# Patient Record
Sex: Female | Born: 1935 | Race: White | Hispanic: No | Smoking: Former smoker
Health system: Southern US, Community
[De-identification: ages and names within clinical notes are randomized; demographics above are authoritative.]

## PROBLEM LIST (undated history)

## (undated) DIAGNOSIS — D649 Anemia, unspecified: Secondary | ICD-10-CM

## (undated) DIAGNOSIS — E785 Hyperlipidemia, unspecified: Secondary | ICD-10-CM

## (undated) DIAGNOSIS — M1990 Unspecified osteoarthritis, unspecified site: Secondary | ICD-10-CM

## (undated) DIAGNOSIS — R011 Cardiac murmur, unspecified: Secondary | ICD-10-CM

## (undated) DIAGNOSIS — I6529 Occlusion and stenosis of unspecified carotid artery: Secondary | ICD-10-CM

## (undated) DIAGNOSIS — N280 Ischemia and infarction of kidney: Secondary | ICD-10-CM

## (undated) DIAGNOSIS — J449 Chronic obstructive pulmonary disease, unspecified: Secondary | ICD-10-CM

## (undated) DIAGNOSIS — I669 Occlusion and stenosis of unspecified cerebral artery: Secondary | ICD-10-CM

## (undated) DIAGNOSIS — Z87442 Personal history of urinary calculi: Secondary | ICD-10-CM

## (undated) DIAGNOSIS — I219 Acute myocardial infarction, unspecified: Secondary | ICD-10-CM

## (undated) DIAGNOSIS — Z9861 Coronary angioplasty status: Secondary | ICD-10-CM

## (undated) DIAGNOSIS — I739 Peripheral vascular disease, unspecified: Secondary | ICD-10-CM

## (undated) DIAGNOSIS — I7409 Other arterial embolism and thrombosis of abdominal aorta: Secondary | ICD-10-CM

## (undated) DIAGNOSIS — I10 Essential (primary) hypertension: Secondary | ICD-10-CM

## (undated) HISTORY — DX: Other arterial embolism and thrombosis of abdominal aorta: I74.09

## (undated) HISTORY — DX: Chronic obstructive pulmonary disease, unspecified: J44.9

## (undated) HISTORY — DX: Ischemia and infarction of kidney: N28.0

## (undated) HISTORY — DX: Essential (primary) hypertension: I10

## (undated) HISTORY — DX: Personal history of urinary calculi: Z87.442

## (undated) HISTORY — DX: Cardiac murmur, unspecified: R01.1

## (undated) HISTORY — DX: Unspecified osteoarthritis, unspecified site: M19.90

## (undated) HISTORY — DX: Anemia, unspecified: D64.9

## (undated) HISTORY — DX: Hyperlipidemia, unspecified: E78.5

## (undated) HISTORY — DX: Occlusion and stenosis of unspecified cerebral artery: I66.9

## (undated) HISTORY — DX: Peripheral vascular disease, unspecified: I73.9

## (undated) HISTORY — DX: Coronary angioplasty status: Z98.61

## (undated) HISTORY — DX: Occlusion and stenosis of unspecified carotid artery: I65.29

## (undated) HISTORY — PX: OTHER SURGICAL HISTORY: SHX169

## (undated) HISTORY — DX: Acute myocardial infarction, unspecified: I21.9

---

## 1995-01-07 DIAGNOSIS — I219 Acute myocardial infarction, unspecified: Secondary | ICD-10-CM

## 1995-01-07 HISTORY — DX: Acute myocardial infarction, unspecified: I21.9

## 1995-01-07 HISTORY — PX: COLON SURGERY: SHX602

## 1995-05-07 DIAGNOSIS — Z9861 Coronary angioplasty status: Secondary | ICD-10-CM

## 1995-05-07 HISTORY — DX: Coronary angioplasty status: Z98.61

## 1996-09-06 HISTORY — PX: CAROTID ENDARTERECTOMY: SUR193

## 1998-03-07 DIAGNOSIS — J449 Chronic obstructive pulmonary disease, unspecified: Secondary | ICD-10-CM

## 1998-03-07 DIAGNOSIS — I7409 Other arterial embolism and thrombosis of abdominal aorta: Secondary | ICD-10-CM

## 1998-03-07 DIAGNOSIS — N280 Ischemia and infarction of kidney: Secondary | ICD-10-CM

## 1998-03-07 DIAGNOSIS — I739 Peripheral vascular disease, unspecified: Secondary | ICD-10-CM

## 1998-03-07 HISTORY — DX: Ischemia and infarction of kidney: N28.0

## 1998-03-07 HISTORY — DX: Peripheral vascular disease, unspecified: I73.9

## 1998-03-07 HISTORY — DX: Other arterial embolism and thrombosis of abdominal aorta: I74.09

## 1998-03-07 HISTORY — DX: Chronic obstructive pulmonary disease, unspecified: J44.9

## 1998-04-02 ENCOUNTER — Encounter: Payer: Self-pay | Admitting: Vascular Surgery

## 1998-04-03 ENCOUNTER — Encounter: Payer: Self-pay | Admitting: Vascular Surgery

## 1998-04-03 ENCOUNTER — Inpatient Hospital Stay: Admission: RE | Admit: 1998-04-03 | Discharge: 1998-04-08 | Payer: Self-pay | Admitting: Vascular Surgery

## 1998-04-03 DIAGNOSIS — I669 Occlusion and stenosis of unspecified cerebral artery: Secondary | ICD-10-CM

## 1998-04-03 DIAGNOSIS — I6529 Occlusion and stenosis of unspecified carotid artery: Secondary | ICD-10-CM

## 1998-04-03 HISTORY — DX: Occlusion and stenosis of unspecified cerebral artery: I66.9

## 1998-04-03 HISTORY — DX: Occlusion and stenosis of unspecified carotid artery: I65.29

## 1998-04-03 HISTORY — PX: PR BPG OTH/THN VEIN AORTOBIFEMORAL: 35646

## 1998-04-04 ENCOUNTER — Encounter: Payer: Self-pay | Admitting: Vascular Surgery

## 2000-07-15 ENCOUNTER — Encounter: Admission: RE | Admit: 2000-07-15 | Discharge: 2000-07-15 | Payer: Self-pay | Admitting: Vascular Surgery

## 2000-07-15 ENCOUNTER — Encounter: Payer: Self-pay | Admitting: Vascular Surgery

## 2000-11-26 ENCOUNTER — Encounter (INDEPENDENT_AMBULATORY_CARE_PROVIDER_SITE_OTHER): Payer: Self-pay | Admitting: Specialist

## 2000-11-26 ENCOUNTER — Other Ambulatory Visit: Admission: RE | Admit: 2000-11-26 | Discharge: 2000-11-26 | Payer: Self-pay | Admitting: Oncology

## 2001-02-21 ENCOUNTER — Emergency Department (HOSPITAL_COMMUNITY): Admission: EM | Admit: 2001-02-21 | Discharge: 2001-02-21 | Payer: Self-pay | Admitting: *Deleted

## 2001-05-26 ENCOUNTER — Encounter (INDEPENDENT_AMBULATORY_CARE_PROVIDER_SITE_OTHER): Payer: Self-pay | Admitting: *Deleted

## 2001-05-26 ENCOUNTER — Ambulatory Visit (HOSPITAL_COMMUNITY): Admission: RE | Admit: 2001-05-26 | Discharge: 2001-05-26 | Payer: Self-pay | Admitting: Gastroenterology

## 2003-04-18 ENCOUNTER — Ambulatory Visit (HOSPITAL_COMMUNITY): Admission: RE | Admit: 2003-04-18 | Discharge: 2003-04-18 | Payer: Self-pay | Admitting: Urology

## 2003-04-20 ENCOUNTER — Ambulatory Visit (HOSPITAL_COMMUNITY): Admission: RE | Admit: 2003-04-20 | Discharge: 2003-04-20 | Payer: Self-pay | Admitting: Urology

## 2003-06-07 DIAGNOSIS — D649 Anemia, unspecified: Secondary | ICD-10-CM

## 2003-06-07 HISTORY — DX: Anemia, unspecified: D64.9

## 2003-11-08 ENCOUNTER — Other Ambulatory Visit: Admission: RE | Admit: 2003-11-08 | Discharge: 2003-11-08 | Payer: Self-pay | Admitting: Family Medicine

## 2003-12-07 ENCOUNTER — Ambulatory Visit (HOSPITAL_COMMUNITY): Admission: RE | Admit: 2003-12-07 | Discharge: 2003-12-07 | Payer: Self-pay | Admitting: Gastroenterology

## 2003-12-07 ENCOUNTER — Encounter (INDEPENDENT_AMBULATORY_CARE_PROVIDER_SITE_OTHER): Payer: Self-pay | Admitting: Specialist

## 2004-12-25 ENCOUNTER — Other Ambulatory Visit: Admission: RE | Admit: 2004-12-25 | Discharge: 2004-12-25 | Payer: Self-pay | Admitting: Family Medicine

## 2006-07-22 ENCOUNTER — Ambulatory Visit: Payer: Self-pay | Admitting: Vascular Surgery

## 2007-03-30 ENCOUNTER — Ambulatory Visit: Payer: Self-pay | Admitting: Vascular Surgery

## 2007-09-07 ENCOUNTER — Ambulatory Visit: Payer: Self-pay | Admitting: Vascular Surgery

## 2007-10-08 ENCOUNTER — Ambulatory Visit: Payer: Self-pay | Admitting: Urology

## 2008-03-28 ENCOUNTER — Ambulatory Visit: Payer: Self-pay | Admitting: Vascular Surgery

## 2008-04-18 ENCOUNTER — Ambulatory Visit: Payer: Self-pay | Admitting: Vascular Surgery

## 2008-05-25 ENCOUNTER — Ambulatory Visit: Payer: Self-pay | Admitting: Vascular Surgery

## 2008-05-25 ENCOUNTER — Encounter: Payer: Self-pay | Admitting: Vascular Surgery

## 2008-05-25 ENCOUNTER — Inpatient Hospital Stay (HOSPITAL_COMMUNITY): Admission: RE | Admit: 2008-05-25 | Discharge: 2008-05-26 | Payer: Self-pay | Admitting: Vascular Surgery

## 2008-05-25 HISTORY — PX: CAROTID ENDARTERECTOMY: SUR193

## 2008-06-20 ENCOUNTER — Ambulatory Visit: Payer: Self-pay | Admitting: Vascular Surgery

## 2009-01-06 DIAGNOSIS — M1990 Unspecified osteoarthritis, unspecified site: Secondary | ICD-10-CM

## 2009-01-06 HISTORY — DX: Unspecified osteoarthritis, unspecified site: M19.90

## 2009-01-18 ENCOUNTER — Ambulatory Visit: Payer: Self-pay | Admitting: Vascular Surgery

## 2009-01-18 ENCOUNTER — Encounter: Admission: RE | Admit: 2009-01-18 | Discharge: 2009-01-18 | Payer: Self-pay | Admitting: Vascular Surgery

## 2010-01-23 ENCOUNTER — Ambulatory Visit
Admission: RE | Admit: 2010-01-23 | Discharge: 2010-01-23 | Payer: Self-pay | Source: Home / Self Care | Attending: Vascular Surgery | Admitting: Vascular Surgery

## 2010-01-27 ENCOUNTER — Encounter: Payer: Self-pay | Admitting: Urology

## 2010-02-01 NOTE — Procedures (Unsigned)
CAROTID DUPLEX EXAM  INDICATION:  Carotid disease.  HISTORY: Diabetes:  No. Cardiac:  PTCA. Hypertension:  Yes. Smoking:  No. Previous Surgery:  Left carotid endarterectomy in 1998, right carotid endarterectomy with right common carotid artery resection on 05/25/2008. CV History:  Currently asymptomatic. Amaurosis Fugax No, Paresthesias No, Hemiparesis No.                                      RIGHT             LEFT Brachial systolic pressure:         170               172 Brachial Doppler waveforms:         Normal            Normal Vertebral direction of flow:        Antegrade         Antegrade DUPLEX VELOCITIES (cm/sec) CCA peak systolic                   80                70 ECA peak systolic                   112               67 ICA peak systolic                   74                43 ICA end diastolic                   13                10 PLAQUE MORPHOLOGY:                                    Heterogenous PLAQUE AMOUNT:                      None              Mild PLAQUE LOCATION:                                      ICA  IMPRESSION: 1. Patent bilateral common iliac artery sites with no hemodynamically     significant stenosis noted in the bilateral internal carotid     arteries. 2. No significant change in Doppler velocities noted when compared to     the previous examination on 01/18/2009.      ___________________________________________ [name]. Edilia Bo, M.D.  CH/MEDQ  D:  01/23/2010  T:  01/23/2010  Job:  [id number]

## 2010-04-16 LAB — BASIC METABOLIC PANEL
BUN: 12 mg/dL (ref 6–23)
Calcium: 8.7 mg/dL (ref 8.4–10.5)
Chloride: 107 mEq/L (ref 96–112)
Glucose, Bld: 123 mg/dL — ABNORMAL HIGH (ref 70–99)

## 2010-04-16 LAB — CBC
HCT: 30.6 % — ABNORMAL LOW (ref 36.0–46.0)
Hemoglobin: 10.6 g/dL — ABNORMAL LOW (ref 12.0–15.0)
Hemoglobin: 13.1 g/dL (ref 12.0–15.0)
MCHC: 34.7 g/dL (ref 30.0–36.0)
MCV: 89.6 fL (ref 78.0–100.0)
RBC: 3.43 MIL/uL — ABNORMAL LOW (ref 3.87–5.11)
RDW: 12.7 % (ref 11.5–15.5)
WBC: 9.1 10*3/uL (ref 4.0–10.5)

## 2010-04-16 LAB — TYPE AND SCREEN: ABO/RH(D): A POS

## 2010-04-16 LAB — COMPREHENSIVE METABOLIC PANEL
Albumin: 3.5 g/dL (ref 3.5–5.2)
Alkaline Phosphatase: 70 U/L (ref 39–117)
CO2: 29 mEq/L (ref 19–32)
Sodium: 141 mEq/L (ref 135–145)
Total Bilirubin: 0.7 mg/dL (ref 0.3–1.2)
Total Protein: 6.7 g/dL (ref 6.0–8.3)

## 2010-04-16 LAB — APTT: aPTT: 28 seconds (ref 24–37)

## 2010-04-16 LAB — URINALYSIS, ROUTINE W REFLEX MICROSCOPIC
Bilirubin Urine: NEGATIVE
Nitrite: NEGATIVE
Specific Gravity, Urine: 1.019 (ref 1.005–1.030)

## 2010-04-16 LAB — URINE MICROSCOPIC-ADD ON

## 2010-04-16 LAB — ABO/RH: ABO/RH(D): A POS

## 2010-04-24 ENCOUNTER — Emergency Department (HOSPITAL_COMMUNITY): Payer: Medicare Other

## 2010-04-24 ENCOUNTER — Inpatient Hospital Stay (HOSPITAL_COMMUNITY)
Admission: EM | Admit: 2010-04-24 | Discharge: 2010-05-03 | DRG: 312 | Disposition: A | Discharge status: Home Health | Payer: Medicare Other | Attending: Internal Medicine | Admitting: Internal Medicine

## 2010-04-24 ENCOUNTER — Inpatient Hospital Stay (HOSPITAL_COMMUNITY): Payer: Medicare Other

## 2010-04-24 DIAGNOSIS — J449 Chronic obstructive pulmonary disease, unspecified: Secondary | ICD-10-CM | POA: Diagnosis present

## 2010-04-24 DIAGNOSIS — J4489 Other specified chronic obstructive pulmonary disease: Secondary | ICD-10-CM | POA: Diagnosis present

## 2010-04-24 DIAGNOSIS — N200 Calculus of kidney: Secondary | ICD-10-CM | POA: Diagnosis present

## 2010-04-24 DIAGNOSIS — S2249XA Multiple fractures of ribs, unspecified side, initial encounter for closed fracture: Secondary | ICD-10-CM | POA: Diagnosis present

## 2010-04-24 DIAGNOSIS — Z951 Presence of aortocoronary bypass graft: Secondary | ICD-10-CM

## 2010-04-24 DIAGNOSIS — D509 Iron deficiency anemia, unspecified: Secondary | ICD-10-CM | POA: Diagnosis present

## 2010-04-24 DIAGNOSIS — E785 Hyperlipidemia, unspecified: Secondary | ICD-10-CM | POA: Diagnosis present

## 2010-04-24 DIAGNOSIS — E7800 Pure hypercholesterolemia, unspecified: Secondary | ICD-10-CM | POA: Diagnosis present

## 2010-04-24 DIAGNOSIS — I5030 Unspecified diastolic (congestive) heart failure: Secondary | ICD-10-CM | POA: Diagnosis present

## 2010-04-24 DIAGNOSIS — I2510 Atherosclerotic heart disease of native coronary artery without angina pectoris: Secondary | ICD-10-CM | POA: Diagnosis present

## 2010-04-24 DIAGNOSIS — R296 Repeated falls: Secondary | ICD-10-CM | POA: Diagnosis present

## 2010-04-24 DIAGNOSIS — R799 Abnormal finding of blood chemistry, unspecified: Secondary | ICD-10-CM | POA: Diagnosis present

## 2010-04-24 DIAGNOSIS — D638 Anemia in other chronic diseases classified elsewhere: Secondary | ICD-10-CM | POA: Diagnosis present

## 2010-04-24 DIAGNOSIS — I10 Essential (primary) hypertension: Secondary | ICD-10-CM | POA: Diagnosis present

## 2010-04-24 DIAGNOSIS — N179 Acute kidney failure, unspecified: Secondary | ICD-10-CM | POA: Diagnosis present

## 2010-04-24 DIAGNOSIS — I4891 Unspecified atrial fibrillation: Secondary | ICD-10-CM | POA: Diagnosis present

## 2010-04-24 DIAGNOSIS — D696 Thrombocytopenia, unspecified: Secondary | ICD-10-CM | POA: Diagnosis present

## 2010-04-24 DIAGNOSIS — J90 Pleural effusion, not elsewhere classified: Secondary | ICD-10-CM | POA: Diagnosis present

## 2010-04-24 DIAGNOSIS — I509 Heart failure, unspecified: Secondary | ICD-10-CM | POA: Diagnosis present

## 2010-04-24 DIAGNOSIS — E876 Hypokalemia: Secondary | ICD-10-CM | POA: Diagnosis present

## 2010-04-24 DIAGNOSIS — E860 Dehydration: Secondary | ICD-10-CM | POA: Diagnosis present

## 2010-04-24 DIAGNOSIS — Y92009 Unspecified place in unspecified non-institutional (private) residence as the place of occurrence of the external cause: Secondary | ICD-10-CM

## 2010-04-24 DIAGNOSIS — I951 Orthostatic hypotension: Principal | ICD-10-CM | POA: Diagnosis present

## 2010-04-24 LAB — POCT I-STAT, CHEM 8
Chloride: 102 mEq/L (ref 96–112)
Creatinine, Ser: 1.6 mg/dL — ABNORMAL HIGH (ref 0.4–1.2)
Potassium: 3.2 mEq/L — ABNORMAL LOW (ref 3.5–5.1)

## 2010-04-24 LAB — CARDIAC PANEL(CRET KIN+CKTOT+MB+TROPI)
CK, MB: 5.7 ng/mL — ABNORMAL HIGH (ref 0.3–4.0)
Relative Index: 4.2 — ABNORMAL HIGH (ref 0.0–2.5)
Total CK: 137 U/L (ref 7–177)

## 2010-04-24 LAB — BRAIN NATRIURETIC PEPTIDE: Pro B Natriuretic peptide (BNP): 175 pg/mL — ABNORMAL HIGH (ref 0.0–100.0)

## 2010-04-24 LAB — DIFFERENTIAL
Basophils Relative: 0 % (ref 0–1)
Eosinophils Relative: 0 % (ref 0–5)
Lymphocytes Relative: 4 % — ABNORMAL LOW (ref 12–46)
Monocytes Relative: 4 % (ref 3–12)

## 2010-04-24 LAB — COMPREHENSIVE METABOLIC PANEL
AST: 31 U/L (ref 0–37)
BUN: 20 mg/dL (ref 6–23)
CO2: 32 mEq/L (ref 19–32)
Calcium: 11.4 mg/dL — ABNORMAL HIGH (ref 8.4–10.5)
GFR calc non Af Amer: 38 mL/min — ABNORMAL LOW (ref >60)
Potassium: 3.8 mEq/L (ref 3.5–5.1)
Total Bilirubin: 1 mg/dL (ref 0.3–1.2)

## 2010-04-24 LAB — CBC
HCT: 36 % (ref 36.0–46.0)
Hemoglobin: 12 g/dL (ref 12.0–15.0)
MCH: 29.7 pg (ref 26.0–34.0)
Platelets: 112 10*3/uL — ABNORMAL LOW (ref 150–400)
RBC: 4.04 MIL/uL (ref 3.87–5.11)
WBC: 7.7 10*3/uL (ref 4.0–10.5)

## 2010-04-24 LAB — TSH: TSH: 0.164 u[IU]/mL — ABNORMAL LOW (ref 0.350–4.500)

## 2010-04-24 LAB — PHOSPHORUS: Phosphorus: 3.7 mg/dL (ref 2.3–4.6)

## 2010-04-24 LAB — LIPID PANEL
Cholesterol: 153 mg/dL (ref 0–200)
LDL Cholesterol: 68 mg/dL (ref 0–99)
Total CHOL/HDL Ratio: 2.3 RATIO

## 2010-04-24 LAB — HEMOGLOBIN A1C
Hgb A1c MFr Bld: 5.6 % (ref <5.7)
Mean Plasma Glucose: 114 mg/dL (ref <117)

## 2010-04-24 LAB — CK TOTAL AND CKMB (NOT AT ARMC): Relative Index: INVALID (ref 0.0–2.5)

## 2010-04-24 NOTE — H&P (Signed)
NAMEHELANE, [name]                 ACCOUNT NO.:  [id number]  MEDICAL RECORD NO.:  [id number]           PATIENT TYPE:  E  LOCATION:  WLED                         FACILITY:  [facility]  PHYSICIAN:  Kathlen Mody, MD       DATE OF BIRTH:  [birth year]  DATE OF ADMISSION:  04/24/2010 DATE OF DISCHARGE:                             HISTORY & PHYSICAL   PRIMARY CARE PHYSICIAN:  An MD at [facility].  VASCULAR SURGEON:  Maisie Fus B. Durwin Nora, P.A.  CHIEF COMPLAINT:  Syncope.  HISTORY OF PRESENT ILLNESS:  This is an 77 year old lady with past medical history of hypertension, coronary artery disease status post CABG, bilateral carotid artery disease status post bilateral carotid endarterectomy, hyperlipidemia and COPD, who was brought in by her son to the ER for syncope at home this a.m.  Most of the history obtained was from the patient and the patient's son at bedside.  This is an 41- year-old lady who woke up earlier this morning around 5:00, went to the bathroom.  As she was coming back from the bathroom, she felt dizzy and felt that she was blacking out and the next minute she knew she was on the floor and could not tell where she was at that time.  There was no witness to her syncope.  The patient is unaware of how long she was on the floor.  The patient slowly got up from the floor and called her son from her cellphone.  The son came back to her house at 7:00 and picked her up and brought her to the ER.  On arrival to the ER, the patient was found to have a small laceration on the left side of the forehead.  The patient had a chest x-ray also done down in the ER.  She was found to have multiple rib fractures on the right side from 4-10.  The patient was complaining of right-sided chest pain for which the chest x-ray was done.  She did complain of dizziness just before the fall.  Denied chest pain, shortness of breath or palpitations.  The patient does complain of occasional dizziness, feels  like she is blacking out, but she never syncopized before.  The patient has a recent history of shingles in March 2012 for which she was treated with antivirals and pain medication.  The patient denies any fever or chills at this time. Denies any cough.  The patient denies any nausea, vomiting, diarrhea or abdominal pain.  The patient denies any tingling or numbness in her extremities.  The patient complains of generalized weakness.  The patient has an extensive coronary artery disease, has a history of CABG and history of bilateral carotid artery disease with bilateral endarterectomy.  The patient denies any headache or blurry vision at this time.  PAST MEDICAL HISTORY: 1. Coronary artery disease status post CABG. 2. Carotid artery disease status post left carotid endarterectomy in     1998 and right carotid endarterectomy in 2010. 3. Hypertension. 4. Hypercholesterolemia. 5. COPD. 6. The patient has nephrolithiasis.  SOCIAL HISTORY:  The patient is widowed.  She lives at  home by herself. She has 3 children.  She denies smoking and alcohol at this time.  ALLERGIES:  She is allergic to CODEINE and MORPHINE SULFATE.  PAST SURGICAL HISTORY: 1. Bilateral carotid endarterectomy. 2. She had aortobifemoral bypass graft placement and right renal     artery bypass graft placement in 2000.  MEDICATIONS:  See med recon for detailed medications and their doses.  REVIEW OF SYSTEMS:  See HPI, otherwise negative.  PHYSICAL EXAMINATION:  VITAL SIGNS:  She is afebrile.  Blood pressure of 136/80, pulse of 72, respiratory rate of 18, saturating 100% on 3 liters of oxygen. GENERAL:  On exam, she is alert, afebrile, in mild distress from right- sided chest pain. HEENT:  Pupils equal and reacting to light.  No JVD.  Dry mucous membranes. NECK:  She has scars on the neck from bilateral carotid endarterectomy. CARDIOVASCULAR:  S1, S2 heard.  Regular rate and rhythm. RESPIRATORY:  Decreased air  entry on the right side.  Air entry is good on the left side.  No wheezing or rhonchi. ABDOMEN:  Soft, nontender, nondistended.  Bowel sounds heard. EXTREMITIES:  No pedal edema. NEUROLOGICAL:  Able to move all extremities at this time.  The patient is on bedrest.  PERTINENT LABS:  The patient had an i-STAT done.  It showed a hemoglobin of 12.9, hematocrit of 38, sodium of 141, potassium of 3.2, chloride 102, glucose 148, BUN 22, creatinine 1.6, bicarb of 31.  RADIOLOGY:  The patient had a CT head without contrast which shows atrophy and chronic microvascular ischemia.  No acute intracranial abnormality.  Chest x-ray with rib x-ray shows multiple right-sided rib fractures as high as the fourth rib and as low as tenth rib.  No definite pneumothorax or hemothorax.  PROBLEM LIST: 1. Syncope. 2. Rib fracture. 3. Hypertension. 4. Hyperlipidemia. 5. Hypokalemia. 6. Acute renal failure.  PLAN: 1. Syncope.  The patient will be admitted to telemetry floor.  Her EKG     was normal sinus rhythm at 72 per minute.  We will get cardiac     enzymes x3 q.6 h.  She will get the syncope workup.  Her CT head     without contrast did not show any acute findings.  We will get a 2-     D echocardiogram.  We will order carotid duplex.  The patient will     be continued on aspirin 81 mg at this time.  The patient will be on     bedrest and with fall precautions.  We will also try to rule out if     the syncope is secondary to her dehydration.  The patient was     already started on IV fluids in the ER.  We will get orthostatics     done at bedside by PT daily for the next few days.  We will also     try to rule out erythema.  We will keep her on telemetry and     monitor for the next 24-48 hours. 2. Multiple rib fractures on the right starting from fourth to tenth     rib.  We will keep her bedrest, pain control, give her incentive     spirometry.  We will get an orthopedic consult for further      recommendations on rib fractures for any intervention needed. 3. Hypertension.  Blood pressure appears to be controlled.  We will     hold her Norvasc at this time. 4. Hyperlipidemia.  We will get a fasting lipid profile and continue     her simvastatin at this time. 5. Hypokalemia.  Replete potassium. 6. Acute renal failure most likely secondary to prerenal azotemia.     The patient has dry mucous membranes.  We will hydrate her with IV     fluids, normal saline at 80 cc/hour and repeat BUN and creatinine     in a.m. 7. For DVT prophylaxis, subcu Lovenox. 8. The patient is full code.          ______________________________ Kathlen Mody, MD     VA/MEDQ  D:  04/24/2010  T:  04/24/2010  Job:  [id number]  Electronically Signed by Kathlen Mody MD on 04/24/2010 09:51:29 PM

## 2010-04-25 ENCOUNTER — Inpatient Hospital Stay (HOSPITAL_COMMUNITY): Payer: Medicare Other

## 2010-04-25 LAB — T4, FREE: Free T4: 1.19 ng/dL (ref 0.80–1.80)

## 2010-04-25 LAB — BASIC METABOLIC PANEL
BUN: 17 mg/dL (ref 6–23)
CO2: 29 mEq/L (ref 19–32)
Calcium: 9.8 mg/dL (ref 8.4–10.5)
Chloride: 112 mEq/L (ref 96–112)
Creatinine, Ser: 1.15 mg/dL (ref 0.4–1.2)
GFR calc Af Amer: 55 mL/min — ABNORMAL LOW (ref >60)
Glucose, Bld: 128 mg/dL — ABNORMAL HIGH (ref 70–99)

## 2010-04-25 LAB — CBC
HCT: 30.5 % — ABNORMAL LOW (ref 36.0–46.0)
Hemoglobin: 10.1 g/dL — ABNORMAL LOW (ref 12.0–15.0)
MCH: 30.1 pg (ref 26.0–34.0)
MCHC: 33.1 g/dL (ref 30.0–36.0)
MCV: 90.8 fL (ref 78.0–100.0)
RBC: 3.36 MIL/uL — ABNORMAL LOW (ref 3.87–5.11)

## 2010-04-25 LAB — URINALYSIS, ROUTINE W REFLEX MICROSCOPIC
Bilirubin Urine: NEGATIVE
Hgb urine dipstick: NEGATIVE
Specific Gravity, Urine: 1.015 (ref 1.005–1.030)
Urobilinogen, UA: 0.2 mg/dL (ref 0.0–1.0)
pH: 6 (ref 5.0–8.0)

## 2010-04-25 LAB — URINE MICROSCOPIC-ADD ON

## 2010-04-25 LAB — CARDIAC PANEL(CRET KIN+CKTOT+MB+TROPI): CK, MB: 5.7 ng/mL — ABNORMAL HIGH (ref 0.3–4.0)

## 2010-04-26 LAB — PTH, INTACT AND CALCIUM: PTH: 31.7 pg/mL (ref 14.0–72.0)

## 2010-04-26 LAB — BASIC METABOLIC PANEL
BUN: 14 mg/dL (ref 6–23)
Creatinine, Ser: 1.15 mg/dL (ref 0.4–1.2)
GFR calc Af Amer: 55 mL/min — ABNORMAL LOW (ref >60)
GFR calc non Af Amer: 45 mL/min — ABNORMAL LOW (ref >60)
Potassium: 3.6 mEq/L (ref 3.5–5.1)

## 2010-04-26 LAB — CBC
MCH: 29.5 pg (ref 26.0–34.0)
MCV: 90 fL (ref 78.0–100.0)
Platelets: 94 10*3/uL — ABNORMAL LOW (ref 150–400)
RBC: 3.49 MIL/uL — ABNORMAL LOW (ref 3.87–5.11)
RDW: 13.4 % (ref 11.5–15.5)
WBC: 6.1 10*3/uL (ref 4.0–10.5)

## 2010-04-26 LAB — URINE CULTURE: Culture: NO GROWTH

## 2010-04-26 LAB — MAGNESIUM: Magnesium: 1.7 mg/dL (ref 1.5–2.5)

## 2010-04-27 ENCOUNTER — Inpatient Hospital Stay (HOSPITAL_COMMUNITY): Payer: Medicare Other

## 2010-04-27 LAB — CBC
MCH: 29.3 pg (ref 26.0–34.0)
MCHC: 32.3 g/dL (ref 30.0–36.0)
Platelets: 94 10*3/uL — ABNORMAL LOW (ref 150–400)
RBC: 3.48 MIL/uL — ABNORMAL LOW (ref 3.87–5.11)
RDW: 13.5 % (ref 11.5–15.5)

## 2010-04-27 LAB — IRON AND TIBC: Iron: 24 ug/dL — ABNORMAL LOW (ref 42–135)

## 2010-04-27 LAB — BASIC METABOLIC PANEL
Chloride: 106 mEq/L (ref 96–112)
GFR calc non Af Amer: 44 mL/min — ABNORMAL LOW (ref >60)
Potassium: 4.4 mEq/L (ref 3.5–5.1)
Sodium: 142 mEq/L (ref 135–145)

## 2010-04-27 LAB — FERRITIN: Ferritin: 480 ng/mL — ABNORMAL HIGH (ref 10–291)

## 2010-04-27 NOTE — Consult Note (Signed)
[name], [name]                 ACCOUNT NO.:  [id number]  MEDICAL RECORD NO.:  [id number]           PATIENT TYPE:  I  LOCATION:  1411                         FACILITY:  [facility]  PHYSICIAN:  Jake Bathe, MD      DATE OF BIRTH:  18-Apr-1927  DATE OF CONSULTATION:  04/25/2010 DATE OF DISCHARGE:                                CONSULTATION   PRIMARY CARE PHYSICIAN:  Dr. Juluis Rainier.  CARDIOLOGIST:  [name], M.D.  REASON FOR CONSULTATION:  Evaluation of syncope as well as elevated troponin.  HISTORY OF PRESENT ILLNESS:  [name] is a very pleasant 77 year old female with coronary artery disease, single vessel, with percutaneous intervention in 1998 by Dr. Amil Amen with peripheral vascular disease, bilateral carotid endarterectomies as well as renal bypass as well as aortobifemoral bypass who sustained a fall, which prompted admission. She states that over the past month or so, she has been treated for right-sided chest wall shingles and they have persisted and have caused a rash as well as some discomfort.  On the night of admission on April 24, 2010, she had gotten up in the middle of night to go to the bathroom to urinate.  She urinated, stood up, was walking towards the bedroom, then felt woozy and passed out.  When she came to she was somewhat confused, had thought at one point that she was under the bed.  She was having difficulty getting up and finally a family member was able to help her after quite some time.  She was unaware of how long she was on the floor.  In the past, she had had some episodes of dizziness, but she has never had any episodes of frank syncope.  She denies any chest discomfort or shortness of breath prior to this episode and now she is having right-sided rib pain due to several fractures in that region.  Cardiac biomarkers were obtained and were mildly abnormal, the most recent demonstrating a troponin of 0.11 with an MB of 5.7 and a CK  of 154.  Her BNP was slightly elevated 175 and on admission her creatinine was 1.35, now down to 1.17.  She currently is resting comfortably in her bed after receiving IV fluids.  PAST MEDICAL HISTORY: 1. Single-vessel coronary artery disease, status post intervention by     Dr. Amil Amen in 1998. 2. Hypertension. 3. Hyperlipidemia. 4. Nephrolithiasis. 5. Single right kidney with bypass to kidney after aortobifemoral     bypass. 6. COPD. 7. History of tobacco abuse, quit in 2000 after 60 years of use.  PAST SURGICAL HISTORY:  Aortobifemoral bypass with right renal artery bypass in 2000, carotid endarterectomies with patch angioplasty in 1998, followed by Dr. Edilia Bo.  SOCIAL HISTORY:  She is widowed, lives alone by herself, is independent, has 3 children.  No current smoking or alcohol.  ALLERGIES:  CODEINE, MORPHINE.  MEDICATIONS AT HOME:  She had been taking, 1. Simvastatin 20 mg a day. 2. Percocet p.r.n. 3. Norvasc 5 mg a day. 4. Aspirin 81 mg a day. 5. Fish oil 4 times a day. 6. Iron tablet daily.  7. Doxazosin 8 mg a day.  FAMILY HISTORY:  Currently noncontributory.  REVIEW OF SYSTEMS:  No bleeding.  No orthopnea.  No preceding chest pain or significant shortness of breath.  Unless specified above, all other 12 review of systems negative.  PHYSICAL EXAMINATION:  VITAL SIGNS:  Blood pressure on arrival to hospital was 136/80 with a pulse of 72; currently, she is 151/65; temperature 98.2; pulse of 77, sinus rhythm; respirations 18; saturating 96% on 2 L.  Orthostatics were performed, which demonstrates a significant change from 179/67 while lying up to 125/63 while standing and this was performed today.  Note, currently on her medications, she is not on any of her antihypertensives, which were doxazosin 8 mg at home as well as Norvasc 5 mg at home. GENERAL:  Alert and oriented x3, no acute distress, resting comfortably in bed. EYES:  Pale conjunctivae.  EOMI.  No  scleral icterus. NECK:  Supple with right-sided carotid bruit, greater than left. Carotid endarterectomy scars noted.  No JVD. CARDIOVASCULAR:  Demonstrates a regular rate and rhythm with a 2/6 systolic murmur heard at right upper sternal border.  Normal PMI. LUNGS:  Clear to auscultation bilaterally with mildly increased expiratory phase.  No wheezes, no rales currently.  ABDOMEN:  Soft, nontender.  Normoactive bowel sounds.  No rebound or guarding. EXTREMITIES:  No clubbing, cyanosis, or edema.  Decreased distal pulses. SKIN:  Warm, dry, and intact with ecchymosis noted especially. HEENT:  A scalp laceration that is sutured. GU:  Deferred. RECTAL:  Deferred. NEURO:  Nonfocal.  No tremors are noted.  LABORATORY DATA:  EKG personally reviewed show sinus rhythm, rate 63, with no other abnormalities.  QTC was 425.  Chest x-ray, personally reviewed, shows moderate right pleural effusion and confluent right lower lobe airspace disease, possible pneumonia, multiple right lateral rib fractures noted.  Echocardiogram personally reviewed shows hyperdynamic left ventricular systolic function of 65% to 70% with mild aortic stenosis, calcified mitral valve ends as well as calcified aortic valve with mildly increased pulmonary artery pressures.  Mildly elevated troponin, MB as noted above, hemoglobin A1c 5.6, TSH was low at 0.1, LDL was 68 at goal, hemoglobin was 12 on admission. Platelet count 112.  ASSESSMENT/PLAN:  An 77 year old female who sustained syncopal episode at home with now documented orthostatic hypotension and mildly elevated troponin as well as MB fraction. 1. Syncope - possible etiologies certainly include orthostatic     hypotension, especially that pressures have been documented during     this hospitalization dropping from 179 down to 125 upon standing.     Certainly, her doxazosin at home may have exacerbated orthostasis     as well as her concomitant shingles illness and  likely     intravascular volume depletion.  This is the most likely etiology     behind her syncope.  Of course, a cardiac etiology cannot be fully     excluded and thus far on telemetry she has been normal.  No     evidence of any pauses.  There is no evidence of any     intraventricular conduction delays or prolonged QT.  Her     echocardiogram does demonstrate structurally normal ejection     fraction with no wall motion abnormalities.  She is hyperdynamic     and with dehydration, this hyperdynamic nature could be     exacerbated.  PLAN: 1. Syncope would be to place a 30-day event monitor upon discharge,     for which I  can help establish or get set up for you.  I do believe     at this time we will allow [place] to further illustrate if there was     any signs of any cardiac arrhythmias present.  Most likely,     however, this is orthostatic in origin, given the documented     orthostatic hypotension or orthostatic changes. 2. Positive troponin - mildly elevated troponin as well as MB fraction     in the setting of recent syncopal illness, most likely a type 2 non-     ST-elevation myocardial infarction that is not true, not     traditional acute coronary syndrome.  This is most likely secondary     to underlying medical illness.  She does have underlying coronary     artery disease, however.  I would continue with current medical     management, which includes aspirin and I would restart statin     therapy.  I would be hesitant to start beta-blocker in her     situation, given her orthostatic changes noted on standing.     Continue to monitor.  Her current chest discomfort is right sided     and secondary to rib fractures.  I do not believe that at this     point any further cardiac testing is warranted.  Of course if after     her rib fractures heal if she is having difficulty or angina-like     symptoms one could consider a nuclear stress testing for further     risk  stratification.  This could be done as an outpatient. 3. Coronary artery disease - single-vessel coronary artery disease,     treated by Dr. Amil Amen in the late 90s.  This was done prior to her     aortobifemoral bypass as well as carotid endarterectomies.  I would     continue with aspirin and statin use. 4. Hyperlipidemia - at home, she was taking simvastatin 20 mg and her     LDL is at goal of 70. 5. Hypertension - currently stable.  Be careful with the     administration of doxazosin, given the possibility of an enhanced     orthostasis.  As stated above, I do not believe any further cardiac workup is warranted at this time.  Continue with supportive care.  Please call me with any questions.  Please call me also when you are about to discharge the patient will be happy to establish or set up an event monitor for you.  Office number is [phone].     Jake Bathe, MD     MCS/MEDQ  D:  04/25/2010  T:  04/26/2010  Job:  [id number]  Electronically Signed by Donato Schultz MD on 04/27/2010 06:41:53 AM

## 2010-04-28 ENCOUNTER — Inpatient Hospital Stay (HOSPITAL_COMMUNITY): Payer: Medicare Other

## 2010-04-28 MED ORDER — IOHEXOL 300 MG/ML  SOLN
80.0000 mL | Freq: Once | INTRAMUSCULAR | Status: AC | PRN
  Administered 2010-04-28: 80 mL via INTRAVENOUS

## 2010-04-29 ENCOUNTER — Inpatient Hospital Stay (HOSPITAL_COMMUNITY): Payer: Medicare Other

## 2010-04-29 DIAGNOSIS — J90 Pleural effusion, not elsewhere classified: Secondary | ICD-10-CM

## 2010-04-29 LAB — BASIC METABOLIC PANEL
BUN: 10 mg/dL (ref 6–23)
Chloride: 111 mEq/L (ref 96–112)
Glucose, Bld: 93 mg/dL (ref 70–99)
Potassium: 3.6 mEq/L (ref 3.5–5.1)
Sodium: 141 mEq/L (ref 135–145)

## 2010-04-29 LAB — CBC
HCT: 30.1 % — ABNORMAL LOW (ref 36.0–46.0)
Hemoglobin: 10 g/dL — ABNORMAL LOW (ref 12.0–15.0)
MCV: 88.5 fL (ref 78.0–100.0)
RBC: 3.4 MIL/uL — ABNORMAL LOW (ref 3.87–5.11)
WBC: 5.4 10*3/uL (ref 4.0–10.5)

## 2010-04-30 ENCOUNTER — Inpatient Hospital Stay (HOSPITAL_COMMUNITY): Payer: Medicare Other

## 2010-04-30 LAB — BASIC METABOLIC PANEL
BUN: 7 mg/dL (ref 6–23)
CO2: 24 mEq/L (ref 19–32)
GFR calc non Af Amer: >60 mL/min (ref >60)
Glucose, Bld: 87 mg/dL (ref 70–99)
Potassium: 3.1 mEq/L — ABNORMAL LOW (ref 3.5–5.1)
Sodium: 142 mEq/L (ref 135–145)

## 2010-04-30 LAB — CBC
HCT: 29.9 % — ABNORMAL LOW (ref 36.0–46.0)
Hemoglobin: 10.1 g/dL — ABNORMAL LOW (ref 12.0–15.0)
MCHC: 33.8 g/dL (ref 30.0–36.0)
MCV: 87.9 fL (ref 78.0–100.0)
RDW: 13.5 % (ref 11.5–15.5)

## 2010-05-01 ENCOUNTER — Inpatient Hospital Stay (HOSPITAL_COMMUNITY): Payer: Medicare Other

## 2010-05-01 LAB — CBC
HCT: 31.7 % — ABNORMAL LOW (ref 36.0–46.0)
Hemoglobin: 10.6 g/dL — ABNORMAL LOW (ref 12.0–15.0)
MCH: 29.7 pg (ref 26.0–34.0)
MCHC: 33.4 g/dL (ref 30.0–36.0)
RDW: 13.7 % (ref 11.5–15.5)

## 2010-05-01 LAB — BASIC METABOLIC PANEL
BUN: 9 mg/dL (ref 6–23)
CO2: 25 mEq/L (ref 19–32)
Calcium: 8.3 mg/dL — ABNORMAL LOW (ref 8.4–10.5)
Creatinine, Ser: 0.69 mg/dL (ref 0.4–1.2)
GFR calc non Af Amer: >60 mL/min (ref >60)
Glucose, Bld: 87 mg/dL (ref 70–99)
Sodium: 144 mEq/L (ref 135–145)

## 2010-05-02 LAB — BASIC METABOLIC PANEL
BUN: 7 mg/dL (ref 6–23)
Calcium: 8.6 mg/dL (ref 8.4–10.5)
Chloride: 110 mEq/L (ref 96–112)
Creatinine, Ser: 0.82 mg/dL (ref 0.4–1.2)
GFR calc Af Amer: >60 mL/min (ref >60)
GFR calc non Af Amer: >60 mL/min (ref >60)

## 2010-05-02 LAB — MAGNESIUM: Magnesium: 1.5 mg/dL (ref 1.5–2.5)

## 2010-05-02 LAB — CBC
MCH: 29 pg (ref 26.0–34.0)
MCHC: 32.9 g/dL (ref 30.0–36.0)
MCV: 88.1 fL (ref 78.0–100.0)
Platelets: 191 10*3/uL (ref 150–400)
RDW: 13.6 % (ref 11.5–15.5)

## 2010-05-03 ENCOUNTER — Inpatient Hospital Stay (HOSPITAL_COMMUNITY): Payer: Medicare Other

## 2010-05-04 NOTE — Discharge Summary (Signed)
NAMESHALONDA, SACHSE                 ACCOUNT NO.:  [id number]  MEDICAL RECORD NO.:  [id number]           PATIENT TYPE:  I  LOCATION:  1411                         FACILITY:  [facility]  PHYSICIAN:  Kathlen Mody, MD       DATE OF BIRTH:  1927-02-11  DATE OF ADMISSION:  04/24/2010 DATE OF DISCHARGE:  05/01/2010                              DISCHARGE SUMMARY   PRIMARY CARE PHYSICIAN:  Dr. Zachery Dauer.  CARDIOLOGIST:  [name], M.D.  DISCHARGE DIAGNOSES: 1. Syncope most likely secondary to orthostasis. 2. Right-sided rib fractures from 4 to 10. 3. Moderate to large pleural effusion. 4. Hypertension. Coronary artery disease status post coronary artery     bypass grafting. 5. Bilateral carotid artery disease status post carotid arterectomy. 6. Chronic obstructive pulmonary disease. 7. Hyperlipidemia. 8. Newly diagnosed paroxysmal atrial fibrillation. 9. Hypercholesterolemia. 10.Nephrolithiasis. 11.Acute renal failure. 12.Hypokalemia. 13.Anemia of chronic disease.  DISCHARGE MEDICATIONS:  To be dictated by the discharge physician.  CONSULTATIONS CALLED: 1. Cardiology consult from Dr. Anne Fu. 2. Cardiothoracic surgery consult.  PERTINENT LABORATORY DATA:  On admission, the patient had CBC done, which was significant for platelets of 112.  Chem-8 shows a hemoglobin of 12.9, hematocrit of 38, sodium of 141, potassium of 3.2, creatinine 1.6, BUN of 22, glucose of 148, chloride of 102, bicarb of 31.  The patient had a cardiac panel done, which showed troponin of 0.11, CK-MB of 5.7 with a normal CK. TSH  of 0.164.  Free T4 and T3 were within normal limits.  Urinalysis was negative for nitrites, showed trace leukocytes.  Urine culture was negative.  Anemia panel showing vitamin B12 of 645, folate of 5.7, ferritin of 480.  The most latest labs, CBC on April 30, 2010 showed a hemoglobin of 10.1, hematocrit of 29, WBC count of 5.6, platelets of 143.  Basic metabolic panel showed a  sodium of 142, potassium of 3.1, chloride of 111, bicarb of 24, BUN of 7, creatinine 0.79, calcium of 7.9.  RADIOLOGY:  The patient had a chest x-ray with rib series showing multiple right-sided rib fractures.  No hemothorax or pneumothorax.  CT head without contrast shows atrophy with chronic microvascular ischemia. No acute intracranial abnormality.  Repeat x-ray later on the day of admission showed moderate sized right pleural effusion superimposed on chronic pleural scarring.  Repeat x-ray on April 25, 2010 shows moderate right pleural effusion, confluent right lower lobe air space disease could be reactive/atelectasis/pulmonary contusion.  Repeat x-ray on April 27, 2010 shows increasing right pleural effusion with decreased ventilation, small left pleural effusion.  A CT chest with contrast shows large right pleural effusion, possibly hemothorax associated with near total right lower lobe collapse on compressive basis.  Repeat x-ray after the thoracentesis shows improved right pleural effusion from thoracentesis, about 780 mL of blood tinged fluid was taken out.  No pneumothorax  An echocardiogram done on April 25, 2010 showed left ventricular ejection fraction about 65-70%.  No regional wall abnormalities. Dopplers consistent with abnormal left ventricle relaxation.  BRIEF HOSPITAL COURSE:  I have an 77 year old lady with history of carotid artery disease status  post carotid endarterectomy, renal bypass and aortobifemoral bypass, hypertension, hyperlipidemia, nephrolithiasis admitted for syncope most likely secondary to dehydration.  On admission, her creatinine was 1.6, her hemoglobin was 12 most likely secondary to dehydration.  She was admitted to telemetry, started on IV fluids.  Cardiac enzymes were done q.6 h.  She had a syncope workup with CT head without contrast, which was negative.  A 2-D echocardiogram showed diastolic dysfunction.  Recent carotid duplex showed patent  ICA. She was started on aspirin 81 mg, and she was hydrated with normal saline.  Over the course of her hospitalization in the next 24 hours, her cardiac enzymes showed elevated troponins and CK-MB at which point Cardiology consult was called.  Cardiology recommended to stop the doxazosin that she was on as her syncope was most likely secondary to her dehydration and orthostatic hypotension.  Elevated troponins could be secondary to acute event and stated that she does not need any further workup at this time.  Over the next 48 hours, the patient had a run of atrial fibrillation as high as 100-110 per minute.  Initially, she was not started on any beta blockers secondary to orthostasis, but the patient was started on a small dose of metoprolol 12.5 mg daily, and she reverted back to sinus spontaneously before she was put on beta blockers. 1. Multiple right-sided rib fractures starting from 4-10.  She was     kept at bedrest for the first 24 hours.  She was given adequate     pain control and put on incentive spirometry, continuous pulse ox     to see if she desaturates.  The patient was also put on nasal     oxygen as needed, and she required about 2-3 liters initially when     she got admitted.  Orthopedic consult was called and spoke to     orthopedician over the phone, recommended no further workup at this     time, just observation.  Over the course of her hospitalization,     the patient developed moderate right pleural effusion, and she     required about 3 liters of oxygen at which point CT scan of the     chest with contrast was done, which showed large right pleural     effusion with possible collapse of the right lung, possibly     hemothorax at which point CT surgery consult was called,     recommended IR to do an ultrasound-guided thoracentesis who removed     about 780 cc of bloody fluid.  A repeat x-ray was done, which     showed improved aeration and decreased right-sided  pleural     effusion.  At this point, we will observe the patient for another     24 hours and get a repeat chest x-ray on May 01, 2010.  If she     does not develop any overt/moderate pleural effusion, then she can     be discharged on May 01, 2010 with appropriate pain medication     and incentive spirometry. 2. Anemia. On admission, the patient's hemoglobin was 12.9, which was     most likely the patient is dehydrated.  After she was given IV     fluids, her hemoglobin reverted back to 10.2 which appears to be     her baseline.  There was no drop in hemoglobin and hematocrit even     though she had moderate effusion.  We would check another  CBC on     April 25.  If hemoglobin and hematocrit is stable, she can be     discharged on ferrous sulfate 325 mg. 3. Acute renal failure most likely secondary to dehydration.     Creatinine and BUN normalized after she received appropriate     hydration. 4. Hyperlipidemia.  Continue simvastatin at this time. 5. Hypokalemia.  Potassium was repleted.  Her potassium is 3.1 today.     She got 60 mEq of K-Dur p.o.  PHYSICAL EXAMINATION:  VITAL SIGNS:  Temperature 98.4, pulse of 79, respirations of 20 per minute, blood pressure 170/68, saturating about 94% on room air. GENERAL:  On exam, she is alert, afebrile, comfortable in bed. CARDIOVASCULAR:  S1, S2 heard. RESPIRATORY:  Improved air entry on the right side.  No wheezing or rhonchi. ABDOMEN:  Soft, nontender.  Bowel sounds are heard. EXTREMITIES:  No pedal edema.          ______________________________ Kathlen Mody, MD     VA/MEDQ  D:  04/30/2010  T:  04/30/2010  Job:  [id number]  Electronically Signed by Kathlen Mody MD on 05/04/2010 01:36:00 PM

## 2010-05-13 NOTE — Discharge Summary (Signed)
[name], [name]                 ACCOUNT NO.:  [id number]  MEDICAL RECORD NO.:  [id number]           PATIENT TYPE:  I  LOCATION:  1411                         FACILITY:  [facility]  PHYSICIAN:  Rosanna Randy, MDDATE OF BIRTH:  03-26-1927  DATE OF ADMISSION:  04/24/2010 DATE OF DISCHARGE:  05/03/2010                              DISCHARGE SUMMARY   ADDENDUM:  Addendum to discharge summary, job #[id number] dictated by Dr. Blake Divine on April 30, 2010.  Main reason for this dictation is updates on the patient's discharge diagnoses and also date of discharge.  PRIMARY CARE PHYSICIAN:  Dr. Dorma Russell, phone [phone].  DISCHARGE DIAGNOSES: 1. Syncope episode most likely secondary to orthostasis. 2. Right-sided rib fractures, multiple from 4-10. 3. Moderate to large pleural effusion, traumatic, secondary to rib     fractures. 4. Hypertension. 5. Hypokalemia. 6. Congestive heart failure. 7. Hyperlipidemia. 8. Paroxysmal atrial fibrillation. 9. Coronary artery disease. 10.Anemia of chronic disease with iron deficiency component. 11.Acute kidney injury thought to be secondary to dehydration and     decreased blood perfusion. 12.Bilateral carotid disease status post endarterectomy. 13.History of nephrolithiasis.  DISCHARGE MEDICATIONS: 1. Amlodipine 10 mg by mouth daily. 2. Lasix 20 mg by mouth daily. 3. Metoprolol 12.5 mg by mouth daily. 4. Oxycodone and acetaminophen 5/325 one to two tablets by mouth every     6 hours as needed for pain. 5. Potassium 20 mEq by mouth daily. 6. Aspirin 81 mg 1 tablet by mouth daily. 7. Calcium citrate and vitamin D 600 mg/400 mg one tablet by mouth     twice a day. 8. Fish oil 1200 mg 1 capsule by mouth 4 times a day. 9. Iron 65 mg 1 tablet by mouth daily. 10.Simvastatin 20 mg 1 tablet by mouth daily. 11.Tylenol Extra Strength. 12.Acetaminophen 500 mg 1 tablet by mouth every 6 hours as needed for     pain. 13.The patient had been advised to stop  using doxazosin, she was     previously taking 8 mg 1 tablet by mouth daily and due to her     syncope this medication had been stopped.  DISPOSITION AND FOLLOWUP:  The patient has been discharged currently in a stable condition and improved with instructions to follow with primary care physician in about 7-10 days.  The patient is going to receive [facility] care to make sure that she continued doing good.  It is going to be important but in the patient's followup to have a basic metabolic panel in order to look after patient's electrolytes and also to look after her renal function.  Both were pretty much into the normal range.  We have just to follow and make sure that no other changes to her medications need to be done and no further repletion of her electrolytes is needed.  It is going to be important to follow the patient's blood pressure and continue adjusting medications accordingly while avoiding significant orthostasis changes.  It is going to be also important to reevaluate the patient's oxygen saturation since after she had her syncope episode and fracture her ribs, she has  developed pleural effusion that even though is improved, there is still a remnant effusion specifically on the right side, and she had been on oxygen and discharged on nasal cannula oxygen.  This needs to be readdressed by primary care physician and decide if she will require no further oxygen supplementation.  The patient will require as mentioned an x-ray in about 10 days in order to have followup of her pleural effusion.  The patient was advised to increase her activity slowly, to follow a low- sodium diet and to take her medications as prescribed.  PROCEDURES PERFORMED DURING THIS HOSPITALIZATION: 1. The patient had a CT of the head without contrast due to her     syncope episode that demonstrated chronic microvascular ischemia     and atrophy without any acute intracranial abnormality.  The      patient had a chest x-ray that demonstrated multiple right-sided     rib fractures with no definitive pneumothorax or hemothorax that     was on April 24, 2010.  Due to the patient's development of     hypoxemia and shortness of breath, portable old chest x-ray was     done in the night of April 18 and that one demonstrated mild CHF     with stable, moderate cardiomegaly and a mild diffuse interstitial     pulmonary edema.  There was a moderate sizes right pleural effusion     superimposed upon chronic pleural scarring.  A follow-up chest x-     ray on April 19 demonstrated multiple right lateral rib fractures     better seen on recent rib series. 2. Moderate right pleural effusion and confluent right lower lobe     airspace disease.  Findings could be reactive/atelectasis, could     reflect pulmonary contusion or less likely developing pneumonia.     Chest x-ray for followup on April 21 demonstrated increasing right     pleural effusion, associated decreased ventilation of the right     lung base and multilevel right rib fractures better seen on     comparison. 3. Small left pleural effusion with mild increased pulmonary vascular     congestion.  CT of the chest on April 22 demonstrated moderate to     large right pleural effusion likely represents a hemothorax in the     presence of multiple right-sided rib fractures.  This could be     further evaluated with a thoracentesis if clinically warranted.     Associated knee and total right lower lobe collapse likely on a     compressive basis without central obstructing mass identified.     Diffuse atherosclerosis. 4. Severe chronic left renal atrophy. 5. Small pericardial and left pleural effusion.  Chest x-ray on April     23 demonstrated improved right pleural effusion following     thoracentesis, no pneumothorax.  A chest x-ray on April 24     demonstrated further increase in size of the right pleural effusion     since yesterday  suspecting small amount of gas within the right     pleural space as well, worsening of associated passive atelectasis     in the right lower lobe, stable cardiomegaly without pulmonary     edema.  The patient did have right decubitus film that demonstrated     right pleural effusion with free-flowing right rib fractures and     large pericardial silhouette.  An x-ray on April 25 demonstrated no  change in the moderate right pleural effusion with basilar airspace     disease, very small left pleural effusion again noted.  The patient     had another x-ray on April 27 that demonstrated stable to improved     moderate right pleural effusion with no change in the small left     effusion.  The patient also had a thoracentesis guided by     ultrasound on April 29, 2010 that retrieved 780 cc of bloody fluid.     No other procedures were performed.  Dr. Laneta Simmers, cardiothoracic     surgeon was consulted and also interventional radiologist for     ultrasound-guided thoracentesis.  No other consultations.  CONSULTATIONS MADE DURING THIS HOSPITALIZATION:  Cardiology.  BRIEF HISTORY OF PRESENT ILLNESS:  An 77 year old lady with a history of carotid artery disease status post carotid endarterectomy, renal bypass and aortobifemoral bypass and also with hypertension, hyperlipidemia, nephrolithiasis, admitted secondary to syncope most likely secondary to dehydration.  On admission, her creatinine was 1.6 with a hemoglobin of 12 most likely secondary to dehydration.  The patient was admitted to telemetry.  She was started on IV fluids.  Cardiac enzymes were done q.6 h.  She had a syncope workup with a CT head without contrast, which was negative, a 2-D echo showing diastolic dysfunction and also a recent carotid Doppler showing patient ICA.  The patient was started on aspirin 81 mg by mouth daily, and she was hydrated with normal saline.  Over the course of her hospitalization in the next 24 hours, her  cardiac enzymes showed elevated troponins and CK-MB at which point Cardiology was consulted.  Cardiology recommended to stop doxazosin that she was on as her syncope was most likely secondary to her dehydration and orthostatic hypotension.  Elevated troponins could be secondary to the acute event and stated that she does not need any further workup.  Over the next 48 hours, the patient had a run of atrial fib as high as 100-110 per minute.  Initially, she was not started on any beta-blockers secondary to orthostasis, but the patient was then started on small doses.  She converted back to sinus rhythm.  Once her pleural effusion demonstrated to be improved and her oxygen saturation also got better even though she had required oxygen, the patient was discharged home with instructions to follow with primary care physician.  PERTINENT LABORATORY DATA THROUGHOUT THIS HOSPITALIZATION:  The patient had a CBC on admission that demonstrated white blood cells 7.7, hemoglobin 12, platelets 112.  She has a PT with a value of 13.2 and INR of 0.98.  Complete metabolic panel with sodium of 034, potassium 3.8, chloride 106, bicarb 32, blood sugar 128, BUN 20, creatinine 1.35. Completely normal LFTs except for albumin of 3.3.  The patient had a magnesium that was 1.9 and phosphorus of 3.7.  BNP was 175.  Lipid profile with total cholesterol of 153, triglycerides 92, HDL 67, LDL 68. Hemoglobin A1c was 5.6.  TSH 0.164.  Free T4 was 1.19 and free T3 was 2.5.  Urinalysis and urine microscopy were negative and also a negative urine culture.  The patient had a PTH done due to a calcium of 11.3 that demonstrated intact PTH 31.7 with a total calcium of 10.5.  Throughout this hospitalization, further BMETs and also CBCs were done in order to track on patient's electrolytes, hemoglobin and renal function.  At the moment of discharge, the patient had a BMET showing potassium of 4.0, magnesium  of 1.7 and a CBC with  white blood cells of 5.3, hemoglobin 10.0, platelets 191.  HOSPITAL COURSE BY PROBLEM: 1. The patient's rib fracture.  There was no dislocation, so at this     point just conservative management with pain regimen was obtained.     The patient is going to use Percocet and also Tylenol as indicated     and will follow with primary care physician as an outpatient. 2. The patient's pleural effusion status post thoracentesis and     reaccumulation but now slowly reabsorbing and getting better.  Due     to this pleural effusion, the patient has now required oxygen to     maintain oxygen saturation.  She is going to follow with primary     care physician for a repeat chest x-ray to readdress need of oxygen     supplementation.  The patient had been instructed to use spirometry     at least 6 times a day to continue helping expanding her lungs. 3. Hypokalemia secondary to the use of diuretics.  This was repleted     and returned back to normal.  At the moment of discharge, the     patient had been now instructed to use daily supplemental     potassium. 4. The patient's syncope secondary to orthostasis, most likely due to     the use of doxazosin.  This medication has been discontinued. 5. The patient's hypertension which was not under well controlled and     in front of orthostasis changes, just mildly adjustments of her new     medication regimen were made.  The patient needs to follow with the     primary care physician for further adjustment of her blood pressure     medications.  She had been instructed to follow a low-sodium diet. 6. The patient's diastolic congestive heart failure.  The patient had     been started on low-dose Lasix and instructed to follow a low-     sodium diet as well. 7. The patient's hyperlipidemia, well controlled.  After checking     lipid profile, we are going to continue statins. 8. The patient's newly paroxysmal atrial fibrillation which converted     back to  sinus rhythm on her own.  She is now on low-dose beta     blocker as well. 9. Coronary artery disease.  Plan is to continue controlling her blood     pressure and also we are going to continue aspirin 81 mg by mouth     daily.  We will also continue statins, and we will recommend a     heart-healthy diet. 10.The patient's anemia.  Plan is to continue using iron     supplementations. 11.Acute kidney injury due to decreased perfusion and dehydration.     After fluid resuscitation, creatinine is back to normal.  The     patient will be followed as an outpatient by primary care physician     where another BMET is going to be repeated to follow her renal     function. 12.The patient's history of bilateral carotid endarterectomy due to     bilateral carotid disease.  Plan is to continue using aspirin. 13.History of nephrolithiasis, at this moment stable.  PHYSICAL EXAMINATION AT DISCHARGE:  VITAL SIGNS:  Temperature 98.7, heart rate 88, respiratory rate 18, blood pressure was 160/64, oxygen saturation was 97% on 2 liters. GENERAL:  The patient was in no acute distress.  LUNGS:  Clear with decreased breath sounds still persisting at the right lower lobe. EXTREMITIES:  Without any edema. HEART:  Regular rate and rhythm. ABDOMEN:  Soft, nontender, nondistended.  Positive bowel sounds. NEUROLOGIC:  Nonfocal.  At discharge, the patient had a chest x-ray that demonstrated improved interval in her right pleural effusion.     Rosanna Randy, MD     CEM/MEDQ  D:  05/03/2010  T:  05/03/2010  Job:  [id number] cc:   Dorma Russell He already retired in 2004  Electronically Signed by Vassie Loll MD on 05/13/2010 10:02:07 PM

## 2010-05-21 NOTE — Assessment & Plan Note (Signed)
OFFICE VISIT   [name], [name]  DOB:  1927-04-02                                       01/18/2009  ZOXWR#:[id number]   I saw the patient in the office today for continued followup of her  carotid disease.  She had a left carotid endarterectomy in July of 1998.  In March of 2000 she had an aortobifemoral bypass graft and a right  renal artery bypass.  Most recently in May of 2010 she had a right  carotid endarterectomy with Dacron patch angioplasty and resection of  redundant right common carotid artery.  She comes in for a 6 month  followup visit.  Since I saw her last she has had no history of stroke,  TIAs, expressive or receptive aphasia or amaurosis fugax.   PAST MEDICAL HISTORY:  Is also significant for hypertension which is  stable on her current medications.  She has had no recent change in her  meds.  In addition, she has hypercholesterolemia which has also been  stable on her current medications.  She has a history of coronary artery  disease but has had no recent cardiac issues.  She denies any history of  diabetes or history of congestive heart failure.  In addition, she has a  kidney stone and a single kidney.   SOCIAL HISTORY:  She is widowed.  She has three children.  She quit  tobacco in 1995.   REVIEW OF SYSTEMS:  CARDIOVASCULAR:  She has had no chest pain, chest  pressure, palpitations or arrhythmias.  She has had no significant  dyspnea on exertion.  She has had some mild claudication in both legs  but no rest pain or nonhealing ulcers.  She has had no history of  stroke.  She has had no history of DVT or phlebitis.  MUSCULOSKELETAL:  She does have arthritis most significantly in her  right hip and also some sciatica in the right leg.  PULMONARY:  She has had no recent productive cough, bronchitis, asthma  or wheezing.   PHYSICAL EXAMINATION:  General:  This is a pleasant 77 year old woman  who appears her stated age.  Vital signs:   Heart rate is 68.  Blood  pressure 168/67.  Lungs:  Are clear bilaterally to auscultation without  rales, rhonchi or wheezing.  Cardiovascular:  She has bilateral carotid  bruits.  She has a regular rate and rhythm with a systolic murmur.  She  has no significant peripheral edema.  She has palpable radial pulses and  palpable femoral pulses.  She has palpable posterior tibial pulses  bilaterally.  Abdomen:  Soft and nontender with normal pitched bowel  sounds and no masses appreciated.  Neurologic:  She has no focal  weakness or paresthesias.   I have independently interpreted her carotid duplex scan which shows no  evidence of recurrent stenosis on the right and no carotid stenosis on  the left.  Both vertebral arteries are patent with normally directed  flow.   Given that she has no evidence of carotid stenosis on either side I have  recommend we extend her followup out to 1 year.  I will see her back in  1 year for a followup carotid duplex scan.  She knows to call sooner if  she has problems.  In the meantime she knows to continue to take her  aspirin.     [name]. Edilia Bo, M.D.  Electronically Signed   CSD/MEDQ  D:  01/18/2009  T:  01/19/2009  Job:  [id number]

## 2010-05-21 NOTE — Procedures (Signed)
CAROTID DUPLEX EXAM   INDICATION:  Follow up carotid artery disease.   HISTORY:  Diabetes:  No.  Cardiac:  PTCA in 1990.  Hypertension:  Yes.  Smoking:  No.  Previous Surgery:  Left CEA in 1998.  CV History:  No.  Amaurosis Fugax No, Paresthesias No, Hemiparesis No.                                       RIGHT             LEFT  Brachial systolic pressure:         186               184  Brachial Doppler waveforms:         WNL               WNL  Vertebral direction of flow:        Antegrade         Antegrade  DUPLEX VELOCITIES (cm/sec)  CCA peak systolic                   M=65, D=234       91  ECA peak systolic                   314               89  ICA peak systolic                   445               80  ICA end diastolic                   114               21  PLAQUE MORPHOLOGY:                  Mixed             N/A  PLAQUE AMOUNT:                      Severe            N/A  PLAQUE LOCATION:                    ICA/CCA/ECA       N/A   IMPRESSION:  1. Right ICA shows evidence of stenosis that is borderline of 60% to      79% and 80% to 99%.  2. Right distal CCA (bifurcation) stenosis with velocities of 234 cm/s      compared to proximal to it at 65 cm/s.  3. Right ECA stenosis.  4. Right ICA stenosis extends approximately 2 cm into proximal      portion, then dives sharply at approximately 3 cm from bifurcation.  5. Left ICA status post CEA shows no evidence of restenosis.  6. Dr. Edilia Bo was informed of results and follow-up appointment      scheduled to see him.   ___________________________________________  [name]. Edilia Bo, M.D.   AS/MEDQ  D:  03/28/2008  T:  03/28/2008  Job:  [id number]

## 2010-05-21 NOTE — Op Note (Signed)
[name], [name]                 ACCOUNT NO.:  [id number]   MEDICAL RECORD NO.:  [id number]          PATIENT TYPE:  INP   LOCATION:  3302                         FACILITY:  [facility]   PHYSICIAN:  [name]. Edilia Bo, M.D.DATE OF BIRTH:  [birth year]   DATE OF PROCEDURE:  05/25/2008  DATE OF DISCHARGE:                               OPERATIVE REPORT   PREOPERATIVE DIAGNOSIS:  Severe right internal carotid artery stenosis.   POSTOPERATIVE DIAGNOSIS:  Severe right internal carotid artery stenosis.   PROCEDURE:  Right carotid endarterectomy with Dacron patch angioplasty  and resection of redundant right common carotid artery.   SURGEON:  [name]. Edilia Bo, MD   ASSISTANT:  Quita Skye. Hart Rochester, MD, and Jerold Coombe, PA   ANESTHESIA:  General.   INDICATIONS:  This is a 77 year old woman who has undergone previous  left carotid endarterectomy.  I have been following her with a moderate  right carotid stenosis.  This progressed to greater than 80% and right  carotid endarterectomy was recommended in order to lower her risk of  future stroke.  Note, the duplex showed that the internal carotid artery  was quite tortuous distal to the plaque.  Procedure and potential  complications were discussed with the patient preoperatively.  All of  her questions were answered and she was agreeable to proceed.   TECHNIQUE:  The patient was taken to the operating room and received a  general anesthetic.  Arterial line had been placed by anesthesia.  The  right neck and chest were prepped and draped in usual sterile fashion.  Incision was made along the anterior border of the sternocleidomastoid.  The dissection was carried down to the common carotid artery which was  dissected free and controlled with Rumel tourniquet.  The facial vein  was divided between 2-0 silk ties.  The internal carotid artery was  controlled above the plaque.  It was fairly high.  The external carotid  artery was then  controlled and the patient was then heparinized.  Clamps  were then placed on the internal, then the external, and the common  carotid artery.  A longitudinal arteriotomy was made in the common  carotid artery and this was extended through the plaque into the  internal carotid artery.  A 12 shunt was placed into the internal  carotid artery back bled and then placed into common carotid artery and  secured with a Rumel tourniquet.  Flow was reestablished through the  shunt.  At the completion of placing the shunt, it was clear that the  plaque in the internal extended very high and I felt really the only way  to safely remove the plaque was to do this without the shunt and as  there was excellent backbleeding, I elected therefore to remove the  shunt, and the internal and common carotid arteries were reclamped.  An  endarterectomy plane was established proximally and the plaque was  sharply divided.  Eversion endarterectomy was performed of the external  carotid artery.  Distally, there was a nice taper in the plaque and no  tacking sutures were required.  The common carotid artery was redundant  once the plaque was removed, and I also knew that distal to the plaque,  there was redundancy in the internal carotid artery; therefore, I felt  there would be significant kinking if I did not resect some of the  redundant common carotid artery.  Two tacking sutures were placed and  then a 2-3 cm segment of common carotid artery was excised, and then the  common carotid artery sewn back end-to-end with running 6-0 Prolene  suture.  The Dacron patch was then sewn using continuous 6-0 Prolene  suture.  Prior to completing the patch closure, the arteries were back  bled and flushed appropriately.  The anastomosis completed and flow  reestablished to the external carotid artery and then to the internal  carotid artery.  At the completion, there was an excellent pulse distal  to the patch and good  Doppler flow.  Hemostasis was obtained in the  wound.  The wound was closed with a deep layer of 3-0 Vicryl.  The  platysma was closed with running 3-0 Vicryl.  The skin was closed with a  4-0 subcuticular stitch.  Sterile dressing was applied.  The patient  tolerated the procedure well, was transferred to recovery room in  satisfactory condition.  All needle and sponge counts were correct.      [name]. Edilia Bo, M.D.  Electronically Signed     CSD/MEDQ  D:  05/25/2008  T:  05/26/2008  Job:  [id number]   cc:   [name], M.D.  [name], M.D.

## 2010-05-21 NOTE — Procedures (Signed)
CAROTID DUPLEX EXAM   INDICATION:  Follow up known carotid artery disease.   HISTORY:  Diabetes:  No.  Cardiac:  PTCA in 1990.  Hypertension:  Yes.  Smoking:  No.  Previous Surgery:  Left carotid endarterectomy.  CV History:  Amaurosis Fugax , Paresthesias , Hemiparesis                                       RIGHT             LEFT  Brachial systolic pressure:         148               148  Brachial Doppler waveforms:         Biphasic          Biphasic  Vertebral direction of flow:        Antegrade         Antegrade  DUPLEX VELOCITIES (cm/sec)  CCA peak systolic                   74                81  ECA peak systolic                   246               100  ICA peak systolic                   462               99  ICA end diastolic                   104               23  PLAQUE MORPHOLOGY:                  Heterogenous      None  PLAQUE AMOUNT:                      Moderate-to-severe                  None  PLAQUE LOCATION:                    ICA, ECA          None   IMPRESSION:  1. High-end of 60-79% stenosis noted in the right internal carotid      artery.  2. Normal carotid duplex noted in the left internal carotid artery,      status post left carotid endarterectomy.  3. Antegrade bilateral vertebral arteries.   ___________________________________________  [name]. Edilia Bo, M.D.   MG/MEDQ  D:  09/07/2007  T:  09/07/2007  Job:  [id number]

## 2010-05-21 NOTE — Procedures (Signed)
CAROTID DUPLEX EXAM   INDICATION:  Followup known carotid artery disease.   HISTORY:  Diabetes:  No  Cardiac:  PTCA in 1990  Hypertension:  Yes  Smoking:  No  Previous Surgery:  Left carotid endarterectomy, please see the note  below.  CV History:  Amaurosis Fugax Yes No, Paresthesias Yes No, Hemiparesis Yes No                                       RIGHT             LEFT  Brachial systolic pressure:         140               150  Brachial Doppler waveforms:         biphasic          biphasic  Vertebral direction of flow:        antegrade         antegrade  DUPLEX VELOCITIES (cm/sec)  CCA peak systolic                   66                82  ECA peak systolic                   260               110  ICA peak systolic                   212               67  ICA end diastolic                   49                18  PLAQUE MORPHOLOGY:                  calcified         none  PLAQUE AMOUNT:                      moderate          none  PLAQUE LOCATION:                    ICA and ECA       none   IMPRESSION:  1. A 60-79% stenosis noted in the right internal carotid artery.  2. Normal carotid duplex with minimal interval changes noted in the      left internal carotid artery.  3. Status post left carotid endarterectomy.  4. Antegrade bilateral vertebral arteries.   ___________________________________________  [name]. Edilia Bo, M.D.   MG/MEDQ  D:  07/22/2006  T:  07/23/2006  Job:  [id number]

## 2010-05-21 NOTE — Discharge Summary (Signed)
[name], [name]                 ACCOUNT NO.:  [id number]   MEDICAL RECORD NO.:  [id number]          PATIENT TYPE:  INP   LOCATION:  3302                         FACILITY:  [facility]   PHYSICIAN:  [name]. Edilia Bo, M.D.DATE OF BIRTH:  08-14-1927   DATE OF ADMISSION:  05/25/2008  DATE OF DISCHARGE:  05/26/2008                               DISCHARGE SUMMARY   REASON FOR ADMISSION:  Greater than 80% right carotid stenosis.   HISTORY:  This is a pleasant 77 year old woman who is status post  previous aortofemoral bypass grafting, right renal artery bypass in  2000.  In 1998, she had a left carotid endarterectomy.  I have been  following a moderate right carotid stenosis which progressed to greater  than 80% and right carotid endarterectomy was recommended in order to  lower her risk of future stroke.  Of note, her preoperative chest x-ray  suggested a small nodule at the apex of the right lung.  She does have a  history of a previous PTCA of the right coronary in 1997 and is followed  by Dr. Amil Amen.  She underwent preoperative cardiac evaluation and is  brought in for elective right carotid endarterectomy.  The remainder of  her history and physical is as dictated without addition or deletion.   HOSPITAL COURSE:  The patient was admitted on May 25, 2008.  She  underwent right carotid endarterectomy with Dacron patch angioplasty and  resection of redundant right common carotid artery.  She did well  postoperatively and awoke neurologically intact.  She was monitored in  the recovery room and then transferred to the step-down unit.  She  remained hemodynamically stable and neurologically intact.  On  postoperative day #1, she had no focal neuro findings, she was  swallowing well, and had no problems ambulating and eating.  She had a  CT scan to workup the nodule seen at the apex and this simply turned out  to be scar tissue.  There was, however, a very small 5-mm nodule in the  right lung and followup CT in 6 months was recommended, which we will  arrange for she comes in for her 15-month followup visit.   PROCEDURES:  Resection of redundant right common carotid artery and  right carotid endarterectomy with Dacron patch angioplasty.  X-rays  include CT scan of the chest with results as described above.   CONDITION ON DISCHARGE:  Good.  Discharge is to home.   DISCHARGE MEDICATIONS:  1. Calcium citrate 630 mg p.o. daily.  2. Simvastatin 80 mg p.o. daily.  3. Amlodipine 5 mg p.o. daily.  4. Doxazosin 8 mg p.o. daily.  5. Multivitamin one p.o. daily.  6. Iron p.o. daily.  7. Fish oil daily.  8. Aspirin 81 mg p.o. daily.      [name]. Edilia Bo, M.D.  Electronically Signed     CSD/MEDQ  D:  05/26/2008  T:  05/26/2008  Job:  [id number]   cc:   C. Duane Lope, M.D.  [name], M.D.

## 2010-05-21 NOTE — Procedures (Signed)
CAROTID DUPLEX EXAM   INDICATION:  Followup carotid artery disease.   HISTORY:  Diabetes:  No.  Cardiac:  PTCA.  Hypertension:  Yes.  Smoking:  No.  Previous Surgery:  Left CEA 1998, right CEA with CCA resection  05/25/2008.  CV History:  Asymptomatic.  Amaurosis Fugax No, Paresthesias No, Hemiparesis No                                       RIGHT             LEFT  Brachial systolic pressure:         162               156  Brachial Doppler waveforms:         WNL               WNL  Vertebral direction of flow:        Antegrade         Antegrade  DUPLEX VELOCITIES (cm/sec)  CCA peak systolic                   90                99  ECA peak systolic                   174               91  ICA peak systolic                   104               99  ICA end diastolic                   26                26  PLAQUE MORPHOLOGY:                  Homogeneous  PLAQUE AMOUNT:                      Minimal           None  PLAQUE LOCATION:                    ICA   IMPRESSION:  1. Right internal carotid artery shows evidence of minimal homogeneous      plaque that does not appear to be hemodynamically significant      status post carotid endarterectomy.  2. Left internal carotid artery shows no evidence of restenosis status      post carotid endarterectomy.   ___________________________________________  [name]. Edilia Bo, M.D.   AS/MEDQ  D:  01/18/2009  T:  01/18/2009  Job:  [id number]

## 2010-05-21 NOTE — Procedures (Signed)
CAROTID DUPLEX EXAM   INDICATION:  Follow up of known carotid artery disease.  Patient is  asymptomatic.   HISTORY:  Diabetes:  No.  Cardiac:  PTCA in 1990.  Hypertension:  Yes.  Smoking:  No.  Previous Surgery:  Left CEA in 1998.  CV History:  Amaurosis Fugax No, Paresthesias No, Hemiparesis No                                       RIGHT             LEFT  Brachial systolic pressure:         128               122  Brachial Doppler waveforms:         WNL               WNL  Vertebral direction of flow:        Antegrade         Antegrade  DUPLEX VELOCITIES (cm/sec)  CCA peak systolic                   112               92  ECA peak systolic                   245               73  ICA peak systolic                   280               93 (M)  ICA end diastolic                   53                22  PLAQUE MORPHOLOGY:                  Mixed, calcific with shadowing      Heterogeneous  PLAQUE AMOUNT:                      Moderate-severe   Minimal  PLAQUE LOCATION:                    Bifurcation, ICA, ECA               ICA   IMPRESSION:  1. Right 60-79% ICA stenosis; however, higher velocities may be      obscured due to acoustic shadowing.  2. No recurrent stenosis status post left CEA with highest velocity      noted at the distal arteriotomy site.  3. Right ECA stenosis.  4. Bilateral antegrade flow in vertebral arteries.  5. Increased velocities noted in right ICA; however, categorically      unchanged from study on 07/22/2006.   ___________________________________________  [name]. Edilia Bo, M.D.   PB/MEDQ  D:  03/30/2007  T:  03/30/2007  Job:  [id number]

## 2010-05-21 NOTE — Assessment & Plan Note (Signed)
OFFICE VISIT   [name], [name]  DOB:  1927/08/14                                       04/18/2008  ZOXWR#:[id number]   I saw the patient in the office today for continued follow-up of her  vascular disease.  This is a pleasant 77 year old woman who had a  previous aortobifemoral bypass graft and right renal artery bypass in  2000.  In 1998 she had a left carotid endarterectomy with Dacron patch  angioplasty.  I have been following a moderate right carotid stenosis.   PAST MEDICAL HISTORY:  1. Significant for a previous PTCA of the right coronary artery in May      1997.  2. Hypertension.  3. Hypercholesterolemia.  4. History of nephrolithiasis.  5. History of COPD.   She denies any history of congestive heart failure or history of  previous MRI.  She is not diabetic.   SOCIAL HISTORY:  She is widowed.  She has three children.  She quit  tobacco in 1994.   ALLERGIES:  Codeine and shellfish.   MEDICATIONS:  1. Calcium citrate 630 mg p.o. daily.  2. Simvastatin 80 mg p.o. daily.  3. Amlodipine 5 mg p.o. daily.  4. Doxazosin 8 mg daily.  5. Multivitamin daily.  6. Iron daily.  7. Fish oil daily.  8. Aspirin 81 mg daily.   REVIEW OF SYSTEMS:  GENERAL:  She had no recent weight loss, weight gain  or problems with appetite.  CARDIAC:  She does have occasional chest pain which is relieved with  nitroglycerin.  She also admits to occasional palpitations and some  dyspnea on exertion.  PULMONARY:  She had no recent productive cough, bronchitis, asthma or  wheezing.  GI:  She had no recent change in bowel habits.  GU:  She has had no dysuria or frequency.  VASCULAR:  She had no claudication, rest pain or nonhealing ulcers.  NEUROLOGIC:  She has had no dizziness, blackouts, headaches or seizures.  ORTHOPEDIC:  She does have some arthritis and occasional muscle pain.  ENT:  She had no recent change her eyesight or hearing.  HEMATOLOGIC:  She had no  bleeding problems or clotting disorders.   PHYSICAL EXAMINATION:  This is a pleasant 77 year old woman who appears  her stated age.  Neck:  Supple.  She has bilateral carotid bruits.  Lungs:  Clear bilaterally to auscultation.  Cardiac:  She has a regular  rate and rhythm.  She does have a systolic murmur.  Abdomen:  Soft and  nontender.  She has palpable femoral pulses and warm well-perfused feet  without ischemic ulcers.  Neurologic:  Exam is nonfocal.   Carotid duplex scan shows a greater than 80% right carotid stenosis with  no evidence of recurrent stenosis on the left side.   Given the progression of the carotid stenosis on the right to greater  than 80%.,  I have recommended right carotid endarterectomy in order to  lower her risk of future stroke.  We have also discussed the potential  complications of surgery including but not limited to bleeding, stroke  (periprocedural risks 1-2%), nerve injury, or MI.  Given her cardiac  history of occasional chest pain,  I have recommend that she see Dr.  Amil Amen preoperatively for preoperative cardiac clearance.  Once this is  complete if she is an acceptable candidate,  we will proceed with right  carotid endarterectomy.   [name]. Edilia Bo, M.D.  Electronically Signed   CSD/MEDQ  D:  04/18/2008  T:  04/19/2008  Job:  [id number]   cc:   [name], M.D.  [name], M.D.

## 2010-05-21 NOTE — Assessment & Plan Note (Signed)
OFFICE VISIT   [name], [name]  DOB:  September 19, 1927                                       06/20/2008  ONGEX#:[id number]   I saw the patient in the office today for followup after her recent  right carotid endarterectomy.  This is 77 year old woman who had  undergone a previous left carotid endarterectomy.  I have been following  a moderate right carotid stenosis which progressed to greater than 80%.  She underwent right carotid endarterectomy with Dacron patch angioplasty  and resection of redundant right common carotid artery on 05/25/2008.  She did well postoperatively and was discharged on postop day #1.  She  returns for her first followup visit.  She has had no recent neurologic  symptoms.  Specifically she has had no focal weakness or paresthesias.   PHYSICAL EXAMINATION:  On examination blood pressure is 188/62, heart  rate is 80.  Incision has healed nicely in the right neck.  She has  bilateral carotid bruits.  Lungs are clear bilaterally to auscultation.  On cardiac exam she has a regular rate and rhythm.  Neurologic exam is  nonfocal.   Overall I am pleased with her progress and I will see her back in 6  months for a followup duplex scan.  She knows to call sooner if she has  problems.  In the meantime she knows to continue taking her aspirin.   [name]. Edilia Bo, M.D.  Electronically Signed   CSD/MEDQ  D:  06/20/2008  T:  06/21/2008  Job:  [id number]

## 2010-05-24 NOTE — Procedures (Signed)
[facility]  Patient:    [name], [name] Visit Number: [id number] MRN: [id number]          Service Type: END Location: ENDO Attending Physician:  Dennison Bulla Ii Dictated by:   Verlin Grills, M.D. Proc. Date: 05/26/01 Admit Date:  05/26/2001 Discharge Date: 05/26/2001   CC:         Redmond Baseman, M.D.   Procedure Report  PROCEDURE:  Screening colonoscopy with rectal polypectomy.  REFERRING PHYSICIAN:  Redmond Baseman, M.D.  INDICATION FOR PROCEDURE:  [name] is a 77 year old female born [birth year]. [name] underwent a colonoscopy approximately six years ago and polyps were removed. She is due for a repeat colonoscopy with polypectomy to prevent colon cancer.  I discussed with [name] the complications associated with colonoscopy and polypectomy including a 15/1000 risk of bleeding and 04/998 risk of colon perforation requiring surgical repair. The patient has signed the operative permit.  ENDOSCOPIST:  Verlin Grills, M.D.  PREMEDICATION:  Versed 5 mg, Demerol 50 mg.  ENDOSCOPE:  Olympus pediatric colonoscope.  DESCRIPTION OF PROCEDURE:  After obtaining informed consent, the patient was placed in the left lateral decubitus position. I administered intravenous Demerol and intravenous Versed to achieve conscious sedation for the procedure. The patients cardiac rhythm, oxygen saturation and blood pressure were monitored throughout the procedure and documented in the medical record.  Anal inspection was normal. Digital rectal examination was normal. The Olympus pediatric video colonoscope was introduced into the rectum and advanced to the cecum. Colonic preparation for the exam today was satisfactory.  RECTUM:  From the proximal rectum, two 0.5 mm sessile polyps were removed with the cold biopsy forceps.  SIGMOID COLON/DESCENDING COLON:  Left colonic  diverticulosis.  SPLENIC FLEXURE:  Normal.  TRANSVERSE COLON:  Normal.  HEPATIC FLEXURE:  Normal.  ASCENDING COLON:  Normal.  CECUM/ILEOCECAL VALVE:  Normal.  ASSESSMENT:  1. Left colonic diverticulosis.  2. From the proximal rectum, two 0.5 mm sessile polyps were removed with     the cold biopsy forceps.  RECOMMENDATIONS:  Repeat colonoscopy in approximately 5 years. Dictated by:   Verlin Grills, M.D. Attending Physician:  Dennison Bulla Ii DD:  05/26/01 TD:  05/27/01 Job: [id number]

## 2010-05-24 NOTE — Op Note (Signed)
[name], [name]                 ACCOUNT NO.:  [id number]   MEDICAL RECORD NO.:  [id number]          PATIENT TYPE:  AMB   LOCATION:  ENDO                         FACILITY:  [facility]   PHYSICIAN:  Danise Edge, M.D.   DATE OF BIRTH:  [birth year]   DATE OF PROCEDURE:  12/07/2003  DATE OF DISCHARGE:                                 OPERATIVE REPORT   PROCEDURE PERFORMED:  Esophagogastroduodenoscopy.   ENDOSCOPIST:  Charolett Bumpers, M.D.   INDICATIONS FOR PROCEDURE:  Ms. Taleigh Gero. Hellmann is a 77 year old female born  [birth year].  Ms. Fake underwent her health maintenance physical exam  performed by Dr. Leodis Sias.  Her stool was guaiac positive.  Her CBC was  normal.  The hemoglobin measured at 12.6 g.  The patient reports no  gastrointestinal bleeding.  She takes an aspirin daily.  On May 26, 2001 she  underwent a proctocolonoscopy to the cecum.  She has left colonic  diverticulosis.  Two diminutive hyperplastic polyps were removed from the  rectum.  She is due for repeat colonoscopy in May 2008.  Esophagogastroduodenoscopy is scheduled today to rule out nonsteroidal anti-  inflammatory drug induced peptic ulcer disease.   PREMEDICATION:  Versed 5 mg, Demerol 30 mg.   INSTRUMENT USED:  Olympus gastroscope.   DESCRIPTION OF PROCEDURE:  After obtaining informed consent, Ms. Bard was  placed in the left lateral decubitus position.  I administered intravenous  Demerol and intravenous Versed to achieve conscious sedation for the  procedure.  The patient's blood pressure, oxygen saturations and cardiac  rhythm were monitored throughout the procedure and documented in the medical  record.   The Olympus gastroscope was passed through the posterior hypopharynx and the  proximal esophagus without difficulty.  The hypopharynx, larynx and vocal  cords appeared normal.   Esophagoscpy:  The proximal, mid and lower segments of the esophageal mucosa  appear normal.   Gastroscopy:   Retroflex view of the gastric cardia and fundus was normal.  The diaphragmatic hiatus was slightly patulous.  The gastric body, antrum  and pylorus appear normal.   Duodenoscopy:  In the very proximal duodenum bulb, there appears to be a 4  mm flat polyp which was biopsied.  The remainder of the duodenal bulb and  descending duodenum appear normal.   ASSESSMENT:  Possible polyp in the proximal duodenal bulb; otherwise normal  esophagogastroduodenoscopy without signs of upper gastrointestinal bleeding  or peptic ulcer disease.      MJ/MEDQ  D:  12/07/2003  T:  12/07/2003  Job:  [id number]   cc:   Maryla Morrow. Modesto Charon, M.D.  [address]  [place]  [place] [address]  Fax: [phone]

## 2010-05-24 NOTE — Op Note (Signed)
NAMEJAZZMYNE, RASNICK                           ACCOUNT NO.:  [id number]   MEDICAL RECORD NO.:  [id number]                   PATIENT TYPE:  AMB   LOCATION:  DAY                                  FACILITY:  [facility]   PHYSICIAN:  Boston Service, M.D.             DATE OF BIRTH:  May 20, 1927   DATE OF PROCEDURE:  04/18/2003  DATE OF DISCHARGE:                                 OPERATIVE REPORT   REFERRING PHYSICIAN:  [name], M.D.  Francis P. Modesto Charon, M.D.   PREOPERATIVE DIAGNOSIS:  1. Congestive heart failure.  2. Atherosclerotic coronary artery disease.  3. Nonfunctioning left kidney.  4. 8 mm calculus in the right kidney.   POSTOPERATIVE DIAGNOSIS:  1. Congestive heart failure.  2. Atherosclerotic coronary artery disease.  3. Nonfunctioning left kidney.  4. 8 mm calculus in the right kidney.   OPERATION/PROCEDURE:  1. Preoperative cardiology evaluation.  2. Double-J stent placement.  3. Followup extracorporeal shock wave lithotripsy.   ANESTHESIA:  Intravenous sedation.   DRAINS:  6-French 28 cm double-J stent.   DESCRIPTION OF PROCEDURE:  The patient was prepped and draped in the dorsal  lithotomy position after institution of adequate level of intravenous  sedation.  A well-lubricated 21-French panendoscope was gently inserted at  the urethral meatus.  Normal urethra and sphincter.  Normal trigone and  orifices.  Minimal efflux left orifice. Clear efflux right orifice.  Left  retrograde showed a thin and very delicate ureter, pelvis and calyces with  prompt drainage at three to five minutes.  Right retrograde showed somewhat  tortuous ureter with 8 mm calculus in the lateral aspect of the mid pole.  Due to the patient's short stature, 5 feet 2 inches, a 6-French 26 cm double-  J stent was selected.  However, with stent in place, after retrograde and  passage of a guidewire, full pigtail was not achieved within the left renal  pelvis.  For this reason, the 6-French 26 cm  stent was withdrawn and  replaced with a 6-French 28 cm stent which allowed full pigtail formation  both within the bladder and within the renal pelvis.  Position of the stent  was confirmed on fluoroscopy.  The bladder was drained, cystoscope was  removed.  The patient was given a B&O suppository and returned to recovery  in satisfactory condition.                                               Boston Service, M.D.    RH/MEDQ  D:  04/18/2003  T:  04/18/2003  Job:  [id number]   cc:   Thelma Barge P. Modesto Charon, M.D.  [address]  [place]  [place] [address]  Fax: [phone]   [name], M.D.  [address] [employer]  [address]  [place]  [place] [address]  Fax: [phone]   Wilber Bihari. Caryn Section, M.D.  [address]  [place]  [place] [address]  Fax: [phone]

## 2011-01-24 ENCOUNTER — Other Ambulatory Visit (INDEPENDENT_AMBULATORY_CARE_PROVIDER_SITE_OTHER): Payer: Medicare Other | Admitting: *Deleted

## 2011-01-24 DIAGNOSIS — I6529 Occlusion and stenosis of unspecified carotid artery: Secondary | ICD-10-CM

## 2011-01-24 DIAGNOSIS — Z48812 Encounter for surgical aftercare following surgery on the circulatory system: Secondary | ICD-10-CM

## 2011-02-03 ENCOUNTER — Encounter: Payer: Self-pay | Admitting: Vascular Surgery

## 2011-02-03 ENCOUNTER — Other Ambulatory Visit: Payer: Self-pay | Admitting: *Deleted

## 2011-02-03 DIAGNOSIS — Z48812 Encounter for surgical aftercare following surgery on the circulatory system: Secondary | ICD-10-CM

## 2011-02-03 DIAGNOSIS — I6529 Occlusion and stenosis of unspecified carotid artery: Secondary | ICD-10-CM

## 2011-02-03 NOTE — Procedures (Unsigned)
CAROTID DUPLEX EXAM  INDICATION:  Carotid disease  HISTORY: Diabetes:  No Cardiac:  PTCA Hypertension:  Yes Smoking:  No Previous Surgery:  Left carotid endarterectomy in 1998, right carotid endarterectomy with CCA resection 05/25/2008 CV History:  Currently asymptomatic Amaurosis Fugax No, Paresthesias No, Hemiparesis No                                      RIGHT             LEFT Brachial systolic pressure:         160               164 Brachial Doppler waveforms:         Normal            Normal Vertebral direction of flow:        Antegrade         Antegrade DUPLEX VELOCITIES (cm/sec) CCA peak systolic                   56                60 ECA peak systolic                   83                63 ICA peak systolic                   57                70 ICA end diastolic                   13                15 PLAQUE MORPHOLOGY:                  Heterogeneous     Heterogeneous PLAQUE AMOUNT:                      Mild              Mild PLAQUE LOCATION:                    CCA               ICA, CCA  IMPRESSION: 1. No hemodynamically significant stenosis at the bilateral internal     carotid arteries with plaque formation as described above. 2. No significant change noted when compared to the previous exam on     01/23/2010.  ___________________________________________ [name]. Edilia Bo, M.D.  CH/MEDQ  D:  01/27/2011  T:  01/27/2011  Job:  [id number]

## 2012-01-01 ENCOUNTER — Encounter: Payer: Self-pay | Admitting: Neurosurgery

## 2012-01-27 ENCOUNTER — Encounter: Payer: Self-pay | Admitting: Neurosurgery

## 2012-01-28 ENCOUNTER — Ambulatory Visit: Payer: Medicare Other | Admitting: Neurosurgery

## 2012-01-28 ENCOUNTER — Ambulatory Visit (INDEPENDENT_AMBULATORY_CARE_PROVIDER_SITE_OTHER): Payer: Medicare Other | Admitting: *Deleted

## 2012-01-28 DIAGNOSIS — I6529 Occlusion and stenosis of unspecified carotid artery: Secondary | ICD-10-CM

## 2012-01-28 DIAGNOSIS — Z48812 Encounter for surgical aftercare following surgery on the circulatory system: Secondary | ICD-10-CM

## 2012-11-24 ENCOUNTER — Other Ambulatory Visit: Payer: Self-pay | Admitting: Vascular Surgery

## 2012-11-24 DIAGNOSIS — Z48812 Encounter for surgical aftercare following surgery on the circulatory system: Secondary | ICD-10-CM

## 2012-11-24 DIAGNOSIS — I6529 Occlusion and stenosis of unspecified carotid artery: Secondary | ICD-10-CM

## 2013-01-25 ENCOUNTER — Encounter: Payer: Self-pay | Admitting: Family

## 2013-01-26 ENCOUNTER — Encounter (INDEPENDENT_AMBULATORY_CARE_PROVIDER_SITE_OTHER): Payer: Self-pay

## 2013-01-26 ENCOUNTER — Ambulatory Visit (HOSPITAL_COMMUNITY)
Admission: RE | Admit: 2013-01-26 | Discharge: 2013-01-26 | Disposition: A | Discharge status: Home / Self Care | Payer: Medicare Other | Source: Ambulatory Visit | Attending: Family | Admitting: Family

## 2013-01-26 ENCOUNTER — Encounter: Payer: Self-pay | Admitting: Vascular Surgery

## 2013-01-26 ENCOUNTER — Encounter: Payer: Self-pay | Admitting: Family

## 2013-01-26 ENCOUNTER — Ambulatory Visit (INDEPENDENT_AMBULATORY_CARE_PROVIDER_SITE_OTHER): Payer: Medicare Other | Admitting: Family

## 2013-01-26 VITALS — BP 200/73 | HR 55 | Resp 14 | Ht 60.0 in | Wt 91.4 lb

## 2013-01-26 DIAGNOSIS — Z48812 Encounter for surgical aftercare following surgery on the circulatory system: Secondary | ICD-10-CM | POA: Insufficient documentation

## 2013-01-26 DIAGNOSIS — Z09 Encounter for follow-up examination after completed treatment for conditions other than malignant neoplasm: Secondary | ICD-10-CM | POA: Insufficient documentation

## 2013-01-26 DIAGNOSIS — I6529 Occlusion and stenosis of unspecified carotid artery: Secondary | ICD-10-CM

## 2013-01-26 NOTE — Patient Instructions (Addendum)
Stroke Prevention Some medical conditions and behaviors are associated with an increased chance of having a stroke. You may prevent a stroke by making healthy choices and managing medical conditions. HOW CAN I REDUCE MY RISK OF HAVING A STROKE?   Stay physically active. Get at least 30 minutes of activity on most or all days.  Do not smoke. It may also be helpful to avoid exposure to secondhand smoke.  Limit alcohol use. Moderate alcohol use is considered to be:  No more than 2 drinks per day for men.  No more than 1 drink per day for nonpregnant women.  Eat healthy foods. This involves  Eating 5 or more servings of fruits and vegetables a day.  Following a diet that addresses high blood pressure (hypertension), high cholesterol, diabetes, or obesity.  Manage your cholesterol levels.  A diet low in saturated fat, trans fat, and cholesterol and high in fiber may control cholesterol levels.  Take any prescribed medicines to control cholesterol as directed by your health care provider.  Manage your diabetes.  A controlled-carbohydrate, controlled-sugar diet is recommended to manage diabetes.  Take any prescribed medicines to control diabetes as directed by your health care provider.  Control your hypertension.  A low-salt (sodium), low-saturated fat, low-trans fat, and low-cholesterol diet is recommended to manage hypertension.  Take any prescribed medicines to control hypertension as directed by your health care provider.  Maintain a healthy weight.  A reduced-calorie, low-sodium, low-saturated fat, low-trans fat, low-cholesterol diet is recommended to manage weight.  Stop drug abuse.  Avoid taking birth control pills.  Talk to your health care provider about the risks of taking birth control pills if you are over 35 years old, smoke, get migraines, or have ever had a blood clot.  Get evaluated for sleep disorders (sleep apnea).  Talk to your health care provider about  getting a sleep evaluation if you snore a lot or have excessive sleepiness.  Take medicines as directed by your health care provider.  For some people, aspirin or blood thinners (anticoagulants) are helpful in reducing the risk of forming abnormal blood clots that can lead to stroke. If you have the irregular heart rhythm of atrial fibrillation, you should be on a blood thinner unless there is a good reason you cannot take them.  Understand all your medicine instructions.  Make sure that other other conditions (such as anemia or atherosclerosis) are addressed. SEEK IMMEDIATE MEDICAL CARE IF:   You have sudden weakness or numbness of the face, arm, or leg, especially on one side of the body.  Your face or eyelid droops to one side.  You have sudden confusion.  You have trouble speaking (aphasia) or understanding.  You have sudden trouble seeing in one or both eyes.  You have sudden trouble walking.  You have dizziness.  You have a loss of balance or coordination.  You have a sudden, severe headache with no known cause.  You have new chest pain or an irregular heartbeat. Any of these symptoms may represent a serious problem that is an emergency. Do not wait to see if the symptoms will go away. Get medical help at once. Call your local emergency services  (911 in [place] Do not drive yourself to the hospital. Document Released: 01/31/2004 Document Revised: 10/13/2012 Document Reviewed: 06/25/2012 ExitCare Patient Information 2014 [employer].   Peripheral Vascular Disease Peripheral Vascular Disease (PVD), also called Peripheral Arterial Disease (PAD), is a circulation problem caused by cholesterol (atherosclerotic plaque) deposits in the   arteries. PVD commonly occurs in the lower extremities (legs) but it can occur in other areas of the body, such as your arms. The cholesterol buildup in the arteries reduces blood flow which can cause pain and other serious problems. The presence  of PVD can place a person at risk for Coronary Artery Disease (CAD).  CAUSES  Causes of PVD can be many. It is usually associated with more than one risk factor such as:   High Cholesterol.  Smoking.  Diabetes.  Lack of exercise or inactivity.  High blood pressure (hypertension).  Obesity.  Family history. SYMPTOMS   When the lower extremities are affected, patients with PVD may experience:  Leg pain with exertion or physical activity. This is called INTERMITTENT CLAUDICATION. This may present as cramping or numbness with physical activity. The location of the pain is associated with the level of blockage. For example, blockage at the abdominal level (distal abdominal aorta) may result in buttock or hip pain. Lower leg arterial blockage may result in calf pain.  As PVD becomes more severe, pain can develop with less physical activity.  In people with severe PVD, leg pain may occur at rest.  Other PVD signs and symptoms:  Leg numbness or weakness.  Coldness in the affected leg or foot, especially when compared to the other leg.  A change in leg color.  Patients with significant PVD are more prone to ulcers or sores on toes, feet or legs. These may take longer to heal or may reoccur. The ulcers or sores can become infected.  If signs and symptoms of PVD are ignored, gangrene may occur. This can result in the loss of toes or loss of an entire limb.  Not all leg pain is related to PVD. Other medical conditions can cause leg pain such as:  Blood clots (embolism) or Deep Vein Thrombosis.  Inflammation of the blood vessels (vasculitis).  Spinal stenosis. DIAGNOSIS  Diagnosis of PVD can involve several different types of tests. These can include:  Pulse Volume Recording Method (PVR). This test is simple, painless and does not involve the use of X-rays. PVR involves measuring and comparing the blood pressure in the arms and legs. An ABI (Ankle-Brachial Index) is calculated.  The normal ratio of blood pressures is 1. As this number becomes smaller, it indicates more severe disease.  < 0.95  indicates significant narrowing in one or more leg vessels.  <0.8 there will usually be pain in the foot, leg or buttock with exercise.  <0.4 will usually have pain in the legs at rest.  <0.25  usually indicates limb threatening PVD.  Doppler detection of pulses in the legs. This test is painless and checks to see if you have a pulses in your legs/feet.  A dye or contrast material (a substance that highlights the blood vessels so they show up on x-ray) may be given to help your caregiver better see the arteries for the following tests. The dye is eliminated from your body by the kidney's. Your caregiver may order blood work to check your kidney function and other laboratory values before the following tests are performed:  Magnetic Resonance Angiography (MRA). An MRA is a picture study of the blood vessels and arteries. The MRA machine uses a large magnet to produce images of the blood vessels.  Computed Tomography Angiography (CTA). A CTA is a specialized x-ray that looks at how the blood flows in your blood vessels. An IV may be inserted into your arm so contrast dye   can be injected.  Angiogram. Is a procedure that uses x-rays to look at your blood vessels. This procedure is minimally invasive, meaning a small incision (cut) is made in your groin. A small tube (catheter) is then inserted into the artery of your groin. The catheter is guided to the blood vessel or artery your caregiver wants to examine. Contrast dye is injected into the catheter. X-rays are then taken of the blood vessel or artery. After the images are obtained, the catheter is taken out. TREATMENT  Treatment of PVD involves many interventions which may include:  Lifestyle changes:  Quitting smoking.  Exercise.  Following a low fat, low cholesterol diet.  Control of diabetes.  Foot care is very  important to the PVD patient. Good foot care can help prevent infection.  Medication:  Cholesterol-lowering medicine.  Blood pressure medicine.  Anti-platelet drugs.  Certain medicines may reduce symptoms of Intermittent Claudication.  Interventional/Surgical options:  Angioplasty. An Angioplasty is a procedure that inflates a balloon in the blocked artery. This opens the blocked artery to improve blood flow.  Stent Implant. A wire mesh tube (stent) is placed in the artery. The stent expands and stays in place, allowing the artery to remain open.  Peripheral Bypass Surgery. This is a surgical procedure that reroutes the blood around a blocked artery to help improve blood flow. This type of procedure may be performed if Angioplasty or stent implants are not an option. SEEK IMMEDIATE MEDICAL CARE IF:   You develop pain or numbness in your arms or legs.  Your arm or leg turns cold, becomes blue in color.  You develop redness, warmth, swelling and pain in your arms or legs. MAKE SURE YOU:   Understand these instructions.  Will watch your condition.  Will get help right away if you are not doing well or get worse. Document Released: 01/31/2004 Document Revised: 03/17/2011 Document Reviewed: 12/28/2007 ExitCare Patient Information 2014 [employer].  
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
## 2013-04-26 ENCOUNTER — Other Ambulatory Visit: Payer: Self-pay | Admitting: Family Medicine

## 2013-04-26 ENCOUNTER — Ambulatory Visit
Admission: RE | Admit: 2013-04-26 | Discharge: 2013-04-26 | Disposition: A | Discharge status: Home / Self Care | Payer: Medicare Other | Source: Ambulatory Visit | Attending: Family Medicine | Admitting: Family Medicine

## 2013-04-26 DIAGNOSIS — M25559 Pain in unspecified hip: Secondary | ICD-10-CM

## 2013-04-26 DIAGNOSIS — W19XXXA Unspecified fall, initial encounter: Secondary | ICD-10-CM

## 2013-05-31 ENCOUNTER — Ambulatory Visit: Payer: Medicare Other

## 2013-05-31 ENCOUNTER — Encounter: Payer: Self-pay | Admitting: Neurology

## 2013-05-31 ENCOUNTER — Ambulatory Visit
Admission: RE | Admit: 2013-05-31 | Discharge: 2013-05-31 | Disposition: A | Discharge status: Home / Self Care | Payer: Medicare Other | Source: Ambulatory Visit | Attending: Neurology | Admitting: Neurology

## 2013-05-31 ENCOUNTER — Ambulatory Visit (INDEPENDENT_AMBULATORY_CARE_PROVIDER_SITE_OTHER): Payer: Medicare Other | Admitting: Neurology

## 2013-05-31 ENCOUNTER — Encounter (INDEPENDENT_AMBULATORY_CARE_PROVIDER_SITE_OTHER): Payer: Self-pay

## 2013-05-31 VITALS — BP 179/76 | HR 69 | Temp 97.2°F | Ht 60.0 in | Wt 97.0 lb

## 2013-05-31 DIAGNOSIS — W19XXXA Unspecified fall, initial encounter: Secondary | ICD-10-CM

## 2013-05-31 DIAGNOSIS — S329XXA Fracture of unspecified parts of lumbosacral spine and pelvis, initial encounter for closed fracture: Secondary | ICD-10-CM

## 2013-05-31 DIAGNOSIS — R269 Unspecified abnormalities of gait and mobility: Secondary | ICD-10-CM

## 2013-05-31 DIAGNOSIS — R011 Cardiac murmur, unspecified: Secondary | ICD-10-CM

## 2013-05-31 DIAGNOSIS — M7989 Other specified soft tissue disorders: Secondary | ICD-10-CM

## 2013-05-31 NOTE — Progress Notes (Signed)
Subjective:    Patient ID: [name] is a 80 y.o. female.  HPI    Leslie Foley, MD, PhD [facility] [address], [address] [address] [place], [place] [address]  Dear Dr. Tenny Craw,   Saw your patient, [name], upon your kind request in my neurologic clinic today for initial consultation of her dizziness. The patient is accompanied by her son today. As you know, [name] is a 80 year old right-handed woman with an underlying complex medical history of hypertension, hyperlipidemia, arthritis, kidney stones status post lithotripsy, significant vascular disease including carotid artery disease, status post left carotid endarterectomy 1998 and right carotid endarterectomy in 2010 as well as aortobifemoral bypass graft placement and right renal artery bypass graft placement in 2000, anemia, COPD, coronary artery disease, status post MI, and PTCA, who reports a fall with rib fractures in 2012 and she fell on 04/22/13 outside her home off the curb and landed on her back. She was found recently to have a fractured pelvis. She had a pelvis X ray on 04/26/13: Acute fractures of the superior and inferior right pelvic ramus. Consider CT of the pelvis to assess further if warranted clinically. 2. Significant degenerative joint disease of the right hip. Osteopenia. Personal and, she has had other falls without injuries. She has no history of loss of consciousness recently or head injury recently. She did apparently have a syncopal spell that led to her fall in 2012. She had a head CT without contrast on 04/24/2010 which showed atrophy and chronic microvascular ischemia, and no acute abnormality. In addition, I personally reviewed the images through the PACS system.  She has mild memory issues, has not driven a car since her fall in 4/15, she had an increase in her BP med, amlodipine some 2 weeks ago. She has noted R leg>L leg swelling in the last 2 days. She has been mobilizing with a  walker without wheels. She had an echocardiogram in 2012. She has a heart murmur. She had a carotid doppler study in 1/15, which showed stable findings. He has had a transient and intermittent diplopia. She is on Lasix 20 mg daily. She has some allergy symptoms. She was supposed to have some physical therapy, but has not had it yet. She has had some elevated BP values and feels better when it is in the 140s. She has no particular complaints today, and states, that she will continue to stay active and continue to live alone.   Her Past Medical History Is Significant For: Past Medical History  Diagnosis Date  . Hypertension   . Arthritis Jan. 2011  . Hyperlipidemia   . Heart murmur   . History of nephrolithiasis 1990  and   1995    s/p Lithotripsy  . Cerebrovascular occlusion April 03, 1998    Extracranial with Left internal Carotid Artery Stenosis  . COPD (chronic obstructive pulmonary disease) March  2000  . Myocardial infarction  1997  . Percutaneous transluminal coronary angioplasty status May 1997    with stenting of Right Coronary Artery  . Bilateral claudication of lower limb March 2000  . Carotid artery occlusion 04/03/1998    Bruit bilateral  . 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 Aortoiliac occlusive disease March 2000    Diffuse  . Anemia June 07, 2003    Her Past Surgical History Is Significant For: Past Surgical History  Procedure Laterality Date  . Kidney stones    . Colon  surgery  1997    Polypectomy benign polyps  . Pr bypass graft othr,aortobifemoral  April 03, 1998    with Right renal artery  BPG  by Dr. Ashley Royalty  . Carotid endarterectomy  Sept. 1998    LEFT cea  . Carotid endarterectomy  May 25, 2008    RIGHT cea    Her Family History Is Significant For: Family History  Problem Relation Age of Onset  . Stroke Mother   . Heart attack Father   . Leukemia Brother   . Cancer Maternal Grandmother     Colon    Her Social History Is Significant  For: History   Social History  . Marital Status: Widowed    Spouse Name: N/A    Number of Children: N/A  . Years of Education: N/A   Social History Main Topics  . Smoking status: Former Smoker    Types: Cigarettes    Quit date: 01/07/1992  . Smokeless tobacco: Never Used  . Alcohol Use: No  . Drug Use: No  . Sexual Activity: None   Other Topics Concern  . None   Social History Narrative  . None    Her Allergies Are:  Allergies  Allergen Reactions  . Codeine   . Shellfish Allergy   :   Her Current Medications Are:  Outpatient Encounter Prescriptions as of 05/31/2013  Medication Sig  . amLODipine (NORVASC) 10 MG tablet Take 10 mg by mouth daily.  Marland Kitchen aspirin 81 MG tablet Take 81 mg by mouth daily.  . calcium citrate (CALCITRATE - DOSED IN MG ELEMENTAL CALCIUM) 950 MG tablet Take 1 tablet by mouth daily.  . Cholecalciferol (VITAMIN D3) 3000 UNITS TABS Take by mouth daily.  Marland Kitchen doxazosin (CARDURA) 8 MG tablet Take 8 mg by mouth at bedtime.  . Ferrous Sulfate (IRON) 325 (65 FE) MG TABS Take by mouth.  . furosemide (LASIX) 10 MG/ML solution Take 20 mg by mouth daily.  Marland Kitchen KLOR-CON M20 20 MEQ tablet   . metoprolol tartrate (LOPRESSOR) 25 MG tablet   . Multiple Vitamin (MULTIVITAMIN) tablet Take 1 tablet by mouth daily.  . Omega-3 Fatty Acids (FISH OIL) 1200 MG CAPS Take by mouth.  . simvastatin (ZOCOR) 80 MG tablet Take 80 mg by mouth at bedtime.  . [DISCONTINUED] amLODipine (NORVASC) 5 MG tablet Take 5 mg by mouth daily.  :   Review of Systems:  Out of a complete 14 point review of systems, all are reviewed and negative with the exception of these symptoms as listed below:   Review of Systems  Constitutional: Positive for unexpected weight change (loss).  HENT: Positive for trouble swallowing.   Eyes: Positive for visual disturbance (diplopia, blurred vision).  Respiratory: Negative.   Cardiovascular: Positive for leg swelling.  Gastrointestinal: Positive for  constipation.  Endocrine: Negative.   Genitourinary: Negative.   Musculoskeletal: Negative.   Skin: Negative.   Allergic/Immunologic: Negative.   Neurological: Positive for dizziness.       Memory loss  Hematological: Negative.   Psychiatric/Behavioral: Positive for sleep disturbance (e.d.s.) and dysphoric mood.    Objective:  Neurologic Exam  Physical Exam Physical Examination:   Filed Vitals:   05/31/13 1359  BP: 179/76  Pulse: 69  Temp:    She did not have any orthostatic symptoms and did not have a blood pressure drop when checking orthostatic blood pressure and pulse.  General Examination: The patient is a very pleasant 80 y.o. female in no acute distress. She appears well-developed  and well-nourished but overall frail appearing. She is well groomed and situated in a clinic WC.   HEENT: Normocephalic, atraumatic, pupils are equal, round and reactive to light and accommodation. Funduscopic exam is normal with sharp disc margins noted. Extraocular tracking is good without limitation to gaze excursion or nystagmus noted. Normal smooth pursuit is noted. Hearing is grossly intact. Face is symmetric with normal facial animation and normal facial sensation. Speech is clear with no dysarthria noted. There is no hypophonia. There is no lip, neck/head, jaw or voice tremor. Neck is supple with full range of passive and active motion. There is a loud radiation of her systolic murmur to both carotids. Oropharynx exam reveals: moderate mouth dryness, adequate dental hygiene and mild airway crowding, due to narrow airway entry. Mallampati is class II. Tongue protrudes centrally and palate elevates symmetrically.  Chest: Clear to auscultation without wheezing, rhonchi or crackles noted.  Heart: S1+S2+0, and she has a loud systolic murmur and slight irregularities in her heart rate.  Abdomen: Soft, non-tender and non-distended with normal bowel sounds appreciated on  auscultation.  Extremities: There is 2+ pitting edema in the right distal leg, particularly her right ankle and a 1+ edema in her left ankle. I did not feel any palpable cord and she is not tender in her calves.   Skin: Warm and dry without trophic changes noted. There are no varicose veins.  Musculoskeletal: exam reveals no obvious joint deformities, tenderness or joint swelling or erythema.   Neurologically:  Mental status: The patient is awake, alert and oriented in all 4 spheres. Her immediate and remote memory, attention, language skills and fund of knowledge are appropriate. There is no evidence of aphasia, agnosia, apraxia or anomia. Speech is clear with normal prosody and enunciation. Thought process is linear. Mood is normal and affect is normal.  Cranial nerves II - XII are as described above under HEENT exam. In addition: shoulder shrug is normal with equal shoulder height noted. Motor exam: Normal bulk, strength and tone is noted. There is no drift, tremor or rebound. Romberg is negative, but she does feel a little insecure with her eyes closed. Reflexes are 2+ throughout, except trace in her ankles. Babinski: Toes are flexor bilaterally. Fine motor skills and coordination: intact with normal finger taps, normal hand movements, normal rapid alternating patting, normal foot taps and normal foot agility.  Cerebellar testing: No dysmetria or intention tremor on finger to nose testing. Heel to shin is unremarkable bilaterally. There is no truncal or gait ataxia.  Sensory exam: intact to light touch, pinprick, vibration, temperature sense proprioception in the upper and lower extremities.  Gait, station and balance: She stands with mild difficulty and pushes herself up from the wheelchair. She has a mildly stooped posture, appropriate for age. She has mild increase in kyphosis in her upper back. She's not noted to lean to one side. She does not have any particular pain when standing. She does  not feel lightheaded. No veering to one side is noted. No leaning to one side is noted. Posture is age-appropriate and stance is narrow based. Gait shows mildly slow and insecure gait. She did not bring her walker today so I did not let her walk a longer distance. She turned slightly insecurely. She does not have a particular tendency to fall.  Assessment and Plan:   In summary, LANORA REVERON is a very pleasant 80 y.o.-year old female with an underlying complex medical history of hypertension, hyperlipidemia, arthritis, kidney stones status  post lithotripsy, significant vascular disease including carotid artery disease, status post left carotid endarterectomy 1998 and right carotid endarterectomy in 2010 as well as aortobifemoral bypass graft placement and right renal artery bypass graft placement in 2000, anemia, COPD, coronary artery disease, status post MI, and PTCA, who has had falls. She has thankfully not fallen frequently but has sustained a recent injury including a pelvic fracture. She has been living alone and is quite adamant that she will continue to live alone and keep him going. I talked to her and her son at length today. Her gait is slightly insecure and I do agree with you to have her evaluated by physical therapy. She may benefit from a rolling walker. She has a non-wheeled walker at this time. I would like to proceed with a lumbar spine x-ray and a head CT because of her recent fall and the fact that she is on aspirin. She has vascular disease. Her gait disorder may be multifactorial in nature. She is advised to change positions slowly and drink more water. She is furthermore advised to discuss her lower extremity swelling with you. While this may be because of the amlodipine. I am surprised that her right leg is more swollen than the left. While she does not endorse any tenderness and I do not palpate any issues in her legs. She may benefit from an ultrasound of her lower extremities but her  son would like to discuss further steps with you first. Today only ordered a head CT in the lumbar spine x-ray. She is reluctant to pursue physical therapy. As far as living alone, I advised her to consider getting a life alert button or similar call alert button. She is currently not driving. I will see her back routinely and we will be in touch with her son regarding her imaging test results. She is going to see an eye doctor soon.   Thank you very much for allowing me to participate in the care of this nice patient. If I can be of any further assistance to you please do not hesitate to call me at [phone].  Sincerely,   Leslie FoleySaima Nolawi Kanady, MD, PhD

## 2013-05-31 NOTE — Patient Instructions (Addendum)
I recommend physical therapy.  I would like for your to see Dr. Tenny Craw regarding your right leg swelling.  Please your walker at all times. Drink more water. Walk on even ground.  I will order a CT head and a lumbar spine X ray.  Please consider getting a call alert button.

## 2013-05-31 NOTE — Progress Notes (Signed)
Quick Note:  Please call patient's son back and advise him that thankfully the lumbar spine MRI did not show any acute abnormality but there is thinning of her bones. She has no evidence of fracture or malalignment.  Huston Foley, MD, PhD [facility] ([facility]  ______

## 2013-06-03 NOTE — Progress Notes (Signed)
Quick Note:  I called and LMVM for son, [name], about the xray of Lumbar Spine. NO fractures or malalignment. Does have some thinning of bones (osteopenia). Call back if questions. ______

## 2013-11-01 ENCOUNTER — Ambulatory Visit: Payer: Medicare Other | Admitting: Neurology

## 2013-12-28 ENCOUNTER — Inpatient Hospital Stay (HOSPITAL_COMMUNITY)
Admission: EM | Admit: 2013-12-28 | Discharge: 2014-01-06 | DRG: 871 | Disposition: E | Payer: Medicare Other | Attending: Internal Medicine | Admitting: Internal Medicine

## 2013-12-28 DIAGNOSIS — I739 Peripheral vascular disease, unspecified: Secondary | ICD-10-CM | POA: Diagnosis present

## 2013-12-28 DIAGNOSIS — I313 Pericardial effusion (noninflammatory): Secondary | ICD-10-CM | POA: Diagnosis present

## 2013-12-28 DIAGNOSIS — J449 Chronic obstructive pulmonary disease, unspecified: Secondary | ICD-10-CM | POA: Diagnosis present

## 2013-12-28 DIAGNOSIS — R001 Bradycardia, unspecified: Secondary | ICD-10-CM | POA: Diagnosis not present

## 2013-12-28 DIAGNOSIS — I2510 Atherosclerotic heart disease of native coronary artery without angina pectoris: Secondary | ICD-10-CM | POA: Diagnosis present

## 2013-12-28 DIAGNOSIS — Z91013 Allergy to seafood: Secondary | ICD-10-CM

## 2013-12-28 DIAGNOSIS — E860 Dehydration: Secondary | ICD-10-CM | POA: Diagnosis present

## 2013-12-28 DIAGNOSIS — Z79899 Other long term (current) drug therapy: Secondary | ICD-10-CM

## 2013-12-28 DIAGNOSIS — R579 Shock, unspecified: Secondary | ICD-10-CM | POA: Diagnosis present

## 2013-12-28 DIAGNOSIS — A419 Sepsis, unspecified organism: Secondary | ICD-10-CM | POA: Diagnosis not present

## 2013-12-28 DIAGNOSIS — Z7982 Long term (current) use of aspirin: Secondary | ICD-10-CM

## 2013-12-28 DIAGNOSIS — N12 Tubulo-interstitial nephritis, not specified as acute or chronic: Secondary | ICD-10-CM

## 2013-12-28 DIAGNOSIS — R6520 Severe sepsis without septic shock: Secondary | ICD-10-CM | POA: Diagnosis present

## 2013-12-28 DIAGNOSIS — IMO0001 Reserved for inherently not codable concepts without codable children: Secondary | ICD-10-CM | POA: Insufficient documentation

## 2013-12-28 DIAGNOSIS — E872 Acidosis: Secondary | ICD-10-CM | POA: Diagnosis present

## 2013-12-28 DIAGNOSIS — M1990 Unspecified osteoarthritis, unspecified site: Secondary | ICD-10-CM | POA: Diagnosis present

## 2013-12-28 DIAGNOSIS — I480 Paroxysmal atrial fibrillation: Secondary | ICD-10-CM | POA: Diagnosis present

## 2013-12-28 DIAGNOSIS — I252 Old myocardial infarction: Secondary | ICD-10-CM

## 2013-12-28 DIAGNOSIS — K659 Peritonitis, unspecified: Secondary | ICD-10-CM | POA: Diagnosis present

## 2013-12-28 DIAGNOSIS — Z885 Allergy status to narcotic agent status: Secondary | ICD-10-CM

## 2013-12-28 DIAGNOSIS — Z87891 Personal history of nicotine dependence: Secondary | ICD-10-CM

## 2013-12-28 DIAGNOSIS — E785 Hyperlipidemia, unspecified: Secondary | ICD-10-CM | POA: Diagnosis present

## 2013-12-28 DIAGNOSIS — W19XXXA Unspecified fall, initial encounter: Secondary | ICD-10-CM | POA: Diagnosis present

## 2013-12-28 DIAGNOSIS — K559 Vascular disorder of intestine, unspecified: Secondary | ICD-10-CM | POA: Diagnosis present

## 2013-12-28 DIAGNOSIS — K529 Noninfective gastroenteritis and colitis, unspecified: Secondary | ICD-10-CM | POA: Diagnosis present

## 2013-12-28 DIAGNOSIS — N179 Acute kidney failure, unspecified: Secondary | ICD-10-CM | POA: Diagnosis present

## 2013-12-28 DIAGNOSIS — R1110 Vomiting, unspecified: Secondary | ICD-10-CM

## 2013-12-28 DIAGNOSIS — I10 Essential (primary) hypertension: Secondary | ICD-10-CM | POA: Diagnosis present

## 2013-12-28 DIAGNOSIS — Z955 Presence of coronary angioplasty implant and graft: Secondary | ICD-10-CM

## 2013-12-28 DIAGNOSIS — Z515 Encounter for palliative care: Secondary | ICD-10-CM

## 2013-12-28 DIAGNOSIS — Z66 Do not resuscitate: Secondary | ICD-10-CM | POA: Diagnosis present

## 2013-12-28 LAB — POC OCCULT BLOOD, ED: Fecal Occult Bld: POSITIVE — AB

## 2013-12-28 MED ORDER — SODIUM CHLORIDE 0.9 % IV BOLUS (SEPSIS)
1000.0000 mL | Freq: Once | INTRAVENOUS | Status: AC
  Administered 2013-12-29: 1000 mL via INTRAVENOUS

## 2013-12-28 NOTE — ED Provider Notes (Signed)
CSN: [id number]     Arrival date & time 2013/05/11  2338 History  This chart was scribed for Tomasita CrumbleAdeleke Latamara Melder, MD by Evon Slackerrance Branch, ED Scribe. This patient was seen in room D30C/D30C and the patient's care was started at 11:45 PM.      Chief Complaint  Patient presents with  . Emesis  . Weakness    The history is provided by the patient and the EMS personnel. No language interpreter was used.    Level 5 Caveat: Age HPI Comments: [name] is a 80 y.o. female brought in by ambulance, who presents to the Emergency Department complaining of weakness onset today.Ems states [name] had some associated vomiting  Ems states [name] loss balance and was a controlled fall. Ems denies head injury or LOC. EMS states [name] has also bradycardiac during transport. Pt states [name] is having some left sided abdominal pain and had a large dark bowel movement in the room. [name] is unable to provide her own history due to her age.  Past Medical History  Diagnosis Date  . Hypertension   . Arthritis Jan. 2011  . Hyperlipidemia   . Heart murmur   . History of nephrolithiasis 1990  and   1995    s/p Lithotripsy  . Cerebrovascular occlusion April 03, 1998    Extracranial with Left internal Carotid Artery Stenosis  . COPD (chronic obstructive pulmonary disease) March  2000  . Myocardial infarction  1997  . Percutaneous transluminal coronary angioplasty status May 1997    with stenting of Right Coronary Artery  . Bilateral claudication of lower limb March 2000  . Carotid artery occlusion 04/03/1998    Bruit bilateral  . 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 Aortoiliac occlusive disease March 2000    Diffuse  . Anemia June 07, 2003   Past Surgical History  Procedure Laterality Date  . Kidney stones    . Colon surgery  1997    Polypectomy benign polyps  . Pr bypass graft othr,aortobifemoral  April 03, 1998    with Right renal artery  BPG  by Dr. Ashley Royalty. Dickson  . Carotid endarterectomy  Sept. 1998    LEFT  cea  . Carotid endarterectomy  May 25, 2008    RIGHT cea   Family History  Problem Relation Age of Onset  . Stroke Mother   . Heart attack Father   . Leukemia Brother   . Cancer Maternal Grandmother     Colon   History  Substance Use Topics  . Smoking status: Former Smoker    Types: Cigarettes    Quit date: 01/07/1992  . Smokeless tobacco: Never Used  . Alcohol Use: No   OB History    No data available     Review of Systems  Unable to perform ROS: Age      Allergies  Codeine and Shellfish allergy  Home Medications   Prior to Admission medications   Medication Sig Start Date End Date Taking? Authorizing Provider  amLODipine (NORVASC) 10 MG tablet Take 10 mg by mouth daily.    Historical Provider, MD  aspirin 81 MG tablet Take 81 mg by mouth daily.    Historical Provider, MD  calcium citrate (CALCITRATE - DOSED IN MG ELEMENTAL CALCIUM) 950 MG tablet Take 1 tablet by mouth daily.    Historical Provider, MD  Cholecalciferol (VITAMIN D3) 3000 UNITS TABS Take by mouth daily.    Historical Provider, MD  doxazosin (CARDURA) 8 MG  tablet Take 8 mg by mouth at bedtime.    Historical Provider, MD  Ferrous Sulfate (IRON) 325 (65 FE) MG TABS Take by mouth.    Historical Provider, MD  furosemide (LASIX) 10 MG/ML solution Take 20 mg by mouth daily.    Historical Provider, MD  KLOR-CON M20 20 MEQ tablet  11/12/12   Historical Provider, MD  metoprolol tartrate (LOPRESSOR) 25 MG tablet  11/12/12   Historical Provider, MD  Multiple Vitamin (MULTIVITAMIN) tablet Take 1 tablet by mouth daily.    Historical Provider, MD  Omega-3 Fatty Acids (FISH OIL) 1200 MG CAPS Take by mouth.    Historical Provider, MD  simvastatin (ZOCOR) 80 MG tablet Take 80 mg by mouth at bedtime.    Historical Provider, MD   There were no vitals taken for this visit.   Physical Exam  Constitutional: No distress.  HENT:  Head: Normocephalic and atraumatic.  Nose: Nose normal.  Mouth/Throat: Oropharynx is clear  and moist. No oropharyngeal exudate.  Eyes: Conjunctivae and EOM are normal. Pupils are equal, round, and reactive to light. No scleral icterus.  Neck: Normal range of motion. Neck supple. No JVD present. No tracheal deviation present. No thyromegaly present.  Cardiovascular: Regular rhythm and normal heart sounds.  Exam reveals no gallop and no friction rub.   No murmur heard. Bradycardia  Pulmonary/Chest: Effort normal and breath sounds normal. No respiratory distress. [name] has no wheezes. [name] exhibits no tenderness.  Abdominal: Soft. Bowel sounds are normal. [name] exhibits no distension and no mass. There is tenderness. There is no rebound and no guarding.  Left lower quadrant tenderness to palpation.  Musculoskeletal: Normal range of motion. [name] exhibits no edema or tenderness.  Lymphadenopathy:    [name] has no cervical adenopathy.  Neurological: [name] is alert.  Skin: Skin is warm and dry. No rash noted. [name] is not diaphoretic. No erythema. No pallor.  Nursing note and vitals reviewed.   ED Course  Procedures (including critical care time) DIAGNOSTIC STUDIES:  Labs Review Labs Reviewed  CBC WITH DIFFERENTIAL - Abnormal; Notable for the following:    WBC 16.3 (*)    Neutrophils Relative % 82 (*)    Neutro Abs 13.3 (*)    All other components within normal limits  COMPREHENSIVE METABOLIC PANEL - Abnormal; Notable for the following:    Potassium 3.0 (*)    Glucose, Bld 192 (*)    BUN 34 (*)    Creatinine, Ser 2.41 (*)    Calcium 12.1 (*)    AST 54 (*)    Alkaline Phosphatase 156 (*)    GFR calc non Af Amer 17 (*)    GFR calc Af Amer 20 (*)    Anion gap 16 (*)    All other components within normal limits  LIPASE, BLOOD - Abnormal; Notable for the following:    Lipase 73 (*)    All other components within normal limits  PROTIME-INR - Abnormal; Notable for the following:    Prothrombin Time 18.2 (*)    All other components within normal limits  URINALYSIS, ROUTINE W REFLEX  MICROSCOPIC - Abnormal; Notable for the following:    APPearance CLOUDY (*)    Specific Gravity, Urine >1.030 (*)    Hgb urine dipstick MODERATE (*)    Protein, ur 100 (*)    Leukocytes, UA TRACE (*)    All other components within normal limits  URINE MICROSCOPIC-ADD ON - Abnormal; Notable for the following:    Bacteria, UA MANY (*)  All other components within normal limits  I-STAT CHEM 8, ED - Abnormal; Notable for the following:    Potassium 3.1 (*)    BUN 35 (*)    Creatinine, Ser 2.30 (*)    Glucose, Bld 192 (*)    Calcium, Ion 1.45 (*)    All other components within normal limits  I-STAT CG4 LACTIC ACID, ED - Abnormal; Notable for the following:    Lactic Acid, Venous 4.49 (*)    All other components within normal limits  POC OCCULT BLOOD, ED - Abnormal; Notable for the following:    Fecal Occult Bld POSITIVE (*)    All other components within normal limits  URINE CULTURE  CULTURE, BLOOD (ROUTINE X 2)  CULTURE, BLOOD (ROUTINE X 2)  I-STAT TROPOININ, ED  I-STAT CG4 LACTIC ACID, ED  TYPE AND SCREEN  PREPARE RBC (CROSSMATCH)    Imaging Review Dg Chest 2 View  12/29/2013   CLINICAL DATA:  Acute onset of shortness of breath and weakness. Initial encounter.  EXAM: CHEST  2 VIEW  COMPARISON:  Chest radiograph from 05/13/2010  FINDINGS: The lungs are hyperexpanded, with flattening of the hemidiaphragms, compatible with COPD. There is mild elevation of the left hemidiaphragm. There is no evidence of focal opacification, pleural effusion or pneumothorax.  The heart is mildly enlarged. No acute osseous abnormalities are seen. External pacing pads are noted. A metallic device is seen overlying the mediastinum. Clips are seen overlying the left side of the neck.  IMPRESSION: No acute cardiopulmonary process seen. Mild elevation of the left hemidiaphragm. Findings of COPD and mild cardiomegaly.   Electronically Signed   By: Roanna RaiderJeffery  Chang M.D.   On: 12/29/2013 01:26   Ct Head Wo  Contrast  12/29/2013   CLINICAL DATA:  Vomiting  EXAM: CT HEAD WITHOUT CONTRAST  TECHNIQUE: Contiguous axial images were obtained from the base of the skull through the vertex without intravenous contrast.  COMPARISON:  04/24/2010  FINDINGS: Mild cortical volume loss noted with proportional ventricular prominence. Areas of periventricular white matter hypodensity are most compatible with small vessel ischemic change. No acute hemorrhage, infarct, or mass lesion is identified. Remote left external capsule infarct reidentified. No midline shift. Minimal ethmoid mucoperiosteal thickening. No skull fracture.  IMPRESSION: Chronic findings as above, no acute intracranial finding.   Electronically Signed   By: Christiana PellantGretchen  Green M.D.   On: 12/29/2013 02:31   Ct Abdomen Pelvis W Contrast  12/29/2013   CLINICAL DATA:  Hematochezia and diarrhea, acute onset. Initial encounter.  EXAM: CT ABDOMEN AND PELVIS WITH CONTRAST  TECHNIQUE: Multidetector CT imaging of the abdomen and pelvis was performed using the standard protocol following bolus administration of intravenous contrast.  CONTRAST:  80 mL of Omnipaque 300 IV contrast  COMPARISON:  Lumbar spine radiograph performed 05/31/2013, and renal ultrasound from 11/03/2008  FINDINGS: Mild nonspecific peribronchial thickening is noted at the lung bases. A small pericardial effusion is noted.  The liver and spleen are unremarkable in appearance. The gallbladder is within normal limits. The pancreas and right adrenal gland are unremarkable. A nonspecific 2.0 cm left adrenal mass is noted.  There is severe chronic left renal atrophy. There is slight right renal atrophy. A massive cyst is noted at the lower pole of the right kidney, measuring approximately 10.9 cm. Chronic severe left-sided hydronephrosis is noted, apparently reflecting a stricture at the proximal left ureter.  No definite renal or ureteral stones are identified.  There is a somewhat unusual pattern of wall  enhancement with regard to the visualized small bowel, which may reflect an underlying infectious or inflammatory process. The small bowel is distended up to 3.0 cm, borderline normal in diameter, without definite evidence of obstruction.  The stomach is within normal limits. No acute vascular abnormalities are seen. The patient is status post aortobifemoral bypass graft. The bypass graft appears fully patent. 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 There is slight ectasia of the abdominal aorta proximal to the bypass graft, without evidence of aneurysmal dilatation. Scattered calcific atherosclerotic disease is seen along the proximal abdominal aorta, with mild associated intramural thrombus, somewhat irregular in appearance.  The appendix is normal in caliber and contains trace air, without evidence for appendicitis.  There is suggestion of mild diffuse wall thickening and associated mild soft tissue inflammation along the ascending and transverse colon, with perhaps minimal wall thickening along the distal sigmoid colon. This may reflect a segmental colitis, either infectious or inflammatory in nature. There is no evidence of ischemia. No definite focal source of bleeding is identified on this study.  The bladder is not definitely seen. The uterus contains multiple small calcified fibroids, and is otherwise unremarkable. The ovaries are difficult to fully assess due to surrounding bowel structures. No inguinal lymphadenopathy is seen.  No acute osseous abnormalities are identified. There are healed fractures of the right superior and inferior pubic rami. There is chronic bilateral hip joint space narrowing, more severe on the right, with associated sclerotic change and osteophyte formation on the right side. Nonspecific sclerotic change and expansion are noted at the right sacroiliac joint.  There is minimal grade 1 anterolisthesis of L3 on L4, reflecting underlying facet disease.   IMPRESSION: 1. Suggestion of mild diffuse wall thickening and soft tissue inflammation along the ascending and transverse colon, with perhaps minimal wall thickening along the distal sigmoid colon. This raises concern for mild segmental colitis, either infectious or inflammatory in nature. This could explain the patient's hematochezia. 2. Somewhat unusual pattern of wall enhancement with regard to the visualized small bowel, which may reflect an underlying infectious or inflammatory process. Small bowel is borderline normal in diameter, without evidence of bowel obstruction. 3. Scattered calcific atherosclerotic disease along the proximal abdominal aorta, with mild irregular appearing intramural thrombus. The aortobifemoral bypass graft remains patent. 4. Severe chronic left-sided hydronephrosis, apparently reflecting a stricture at the proximal left ureter. Severe left renal atrophy noted. 5. Very large right renal cyst noted. 6. Nonspecific 2.0 cm left adrenal mass noted. Adrenal protocol MRI or CT could be considered for further evaluation, when and as deemed clinically appropriate. 7. Small pericardial effusion noted. 8. Nonspecific mild peribronchial thickening noted at the lung bases. 9. Chronic bilateral hip joint space narrowing, more severe on the right, with associated right-sided sclerotic change and osteophyte formation.   Electronically Signed   By: Roanna Raider M.D.   On: 12/29/2013 02:36     EKG Interpretation   Date/Time:  Thursday December 29 2013 00:17:36 EST Ventricular Rate:  39 PR Interval:    QRS Duration: 96 QT Interval:  617 QTC Calculation: 497 R Axis:   84 Text Interpretation:  Artifact Confirmed by Erroll Luna [phone])  on 12/29/2013 1:07:31 AM      MDM   Final diagnoses:  None      Patient since emergency department out of concern for weakness and vomiting. History is cannot be obtained from the patient due to her age and possible dementia. In the room  [name]  had a large dark movement, which was Hemoccult positive. Blood pressures in the 90 systolic with a heart rate in the 30s. Will obtain a second IV, 1 L bolus, type and screen sent for 2 units.  Will perform septic workup as well as CT of the abdomen and pelvis. Patient is currently critical, will perform frequent re-evaluations.  Upon repeat evaluation the patient, her heart rate has jumped up to the 50s to 60s. Her blood pressures in the low 100s as well. Her baseline appears to be around 150 or 160. We'll repeat lactate as well as CBC.  Urinalysis shows an infection, [name] was given vancomycin and Zosyn for treatment. Patient was admitted to step down unit, try hospitalist.  CT head is negative, CT of the abdomen and pelvis reveals a colitis that may be causing her bloody stools.  CRITICAL CARE Performed by: Tomasita Crumble   Total critical care time:63min  Critical care time was exclusive of separately billable procedures and treating other patients.  Critical care was necessary to treat or prevent imminent or life-threatening deterioration.  Critical care was time spent personally by me on the following activities: development of treatment plan with patient and/or surrogate as well as nursing, discussions with consultants, evaluation of patient's response to treatment, examination of patient, obtaining history from patient or surrogate, ordering and performing treatments and interventions, ordering and review of laboratory studies, ordering and review of radiographic studies, pulse oximetry and re-evaluation of patient's condition.    I personally performed the services described in this documentation, which was scribed in my presence. The recorded information has been reviewed and is accurate.      Tomasita Crumble, MD 12/29/13 [phone]

## 2013-12-29 ENCOUNTER — Emergency Department (HOSPITAL_COMMUNITY): Payer: Medicare Other

## 2013-12-29 ENCOUNTER — Encounter (HOSPITAL_COMMUNITY): Payer: Self-pay | Admitting: *Deleted

## 2013-12-29 ENCOUNTER — Telehealth: Payer: Self-pay | Admitting: *Deleted

## 2013-12-29 DIAGNOSIS — I252 Old myocardial infarction: Secondary | ICD-10-CM | POA: Diagnosis not present

## 2013-12-29 DIAGNOSIS — I10 Essential (primary) hypertension: Secondary | ICD-10-CM

## 2013-12-29 DIAGNOSIS — R579 Shock, unspecified: Secondary | ICD-10-CM

## 2013-12-29 DIAGNOSIS — Z66 Do not resuscitate: Secondary | ICD-10-CM | POA: Diagnosis present

## 2013-12-29 DIAGNOSIS — Z91013 Allergy to seafood: Secondary | ICD-10-CM | POA: Diagnosis not present

## 2013-12-29 DIAGNOSIS — K659 Peritonitis, unspecified: Secondary | ICD-10-CM | POA: Diagnosis present

## 2013-12-29 DIAGNOSIS — IMO0001 Reserved for inherently not codable concepts without codable children: Secondary | ICD-10-CM | POA: Insufficient documentation

## 2013-12-29 DIAGNOSIS — W19XXXA Unspecified fall, initial encounter: Secondary | ICD-10-CM | POA: Diagnosis present

## 2013-12-29 DIAGNOSIS — K559 Vascular disorder of intestine, unspecified: Secondary | ICD-10-CM

## 2013-12-29 DIAGNOSIS — I2510 Atherosclerotic heart disease of native coronary artery without angina pectoris: Secondary | ICD-10-CM | POA: Diagnosis present

## 2013-12-29 DIAGNOSIS — R001 Bradycardia, unspecified: Secondary | ICD-10-CM | POA: Diagnosis present

## 2013-12-29 DIAGNOSIS — Z515 Encounter for palliative care: Secondary | ICD-10-CM | POA: Diagnosis not present

## 2013-12-29 DIAGNOSIS — N179 Acute kidney failure, unspecified: Secondary | ICD-10-CM | POA: Diagnosis present

## 2013-12-29 DIAGNOSIS — Z87891 Personal history of nicotine dependence: Secondary | ICD-10-CM | POA: Diagnosis not present

## 2013-12-29 DIAGNOSIS — A419 Sepsis, unspecified organism: Secondary | ICD-10-CM | POA: Diagnosis present

## 2013-12-29 DIAGNOSIS — K529 Noninfective gastroenteritis and colitis, unspecified: Secondary | ICD-10-CM | POA: Diagnosis present

## 2013-12-29 DIAGNOSIS — Z7982 Long term (current) use of aspirin: Secondary | ICD-10-CM | POA: Diagnosis not present

## 2013-12-29 DIAGNOSIS — E785 Hyperlipidemia, unspecified: Secondary | ICD-10-CM | POA: Diagnosis present

## 2013-12-29 DIAGNOSIS — E860 Dehydration: Secondary | ICD-10-CM | POA: Diagnosis present

## 2013-12-29 DIAGNOSIS — I480 Paroxysmal atrial fibrillation: Secondary | ICD-10-CM | POA: Diagnosis present

## 2013-12-29 DIAGNOSIS — Z79899 Other long term (current) drug therapy: Secondary | ICD-10-CM | POA: Diagnosis not present

## 2013-12-29 DIAGNOSIS — J449 Chronic obstructive pulmonary disease, unspecified: Secondary | ICD-10-CM | POA: Diagnosis present

## 2013-12-29 DIAGNOSIS — Z955 Presence of coronary angioplasty implant and graft: Secondary | ICD-10-CM | POA: Diagnosis not present

## 2013-12-29 DIAGNOSIS — M1990 Unspecified osteoarthritis, unspecified site: Secondary | ICD-10-CM | POA: Diagnosis present

## 2013-12-29 DIAGNOSIS — I739 Peripheral vascular disease, unspecified: Secondary | ICD-10-CM | POA: Diagnosis present

## 2013-12-29 DIAGNOSIS — E872 Acidosis: Secondary | ICD-10-CM | POA: Diagnosis present

## 2013-12-29 DIAGNOSIS — I313 Pericardial effusion (noninflammatory): Secondary | ICD-10-CM | POA: Diagnosis present

## 2013-12-29 DIAGNOSIS — R6520 Severe sepsis without septic shock: Secondary | ICD-10-CM | POA: Diagnosis present

## 2013-12-29 DIAGNOSIS — Z885 Allergy status to narcotic agent status: Secondary | ICD-10-CM | POA: Diagnosis not present

## 2013-12-29 LAB — URINE MICROSCOPIC-ADD ON

## 2013-12-29 LAB — CBC WITH DIFFERENTIAL/PLATELET
Basophils Absolute: 0 10*3/uL (ref 0.0–0.1)
Basophils Absolute: 0 10*3/uL (ref 0.0–0.1)
Basophils Relative: 0 % (ref 0–1)
Basophils Relative: 0 % (ref 0–1)
EOS ABS: 0.1 10*3/uL (ref 0.0–0.7)
EOS PCT: 0 % (ref 0–5)
Eosinophils Absolute: 0 10*3/uL (ref 0.0–0.7)
Eosinophils Relative: 0 % (ref 0–5)
HCT: 36.8 % (ref 36.0–46.0)
HEMATOCRIT: 41.7 % (ref 36.0–46.0)
HEMOGLOBIN: 14 g/dL (ref 12.0–15.0)
Hemoglobin: 12.3 g/dL (ref 12.0–15.0)
LYMPHS ABS: 0.5 10*3/uL — AB (ref 0.7–4.0)
Lymphocytes Relative: 14 % (ref 12–46)
Lymphocytes Relative: 6 % — ABNORMAL LOW (ref 12–46)
Lymphs Abs: 2.2 10*3/uL (ref 0.7–4.0)
MCH: 30 pg (ref 26.0–34.0)
MCH: 30.2 pg (ref 26.0–34.0)
MCHC: 33.4 g/dL (ref 30.0–36.0)
MCHC: 33.6 g/dL (ref 30.0–36.0)
MCV: 89.3 fL (ref 78.0–100.0)
MCV: 90.4 fL (ref 78.0–100.0)
MONO ABS: 0.7 10*3/uL (ref 0.1–1.0)
MONOS PCT: 4 % (ref 3–12)
Monocytes Absolute: 0.4 10*3/uL (ref 0.1–1.0)
Monocytes Relative: 4 % (ref 3–12)
NEUTROS PCT: 82 % — AB (ref 43–77)
Neutro Abs: 13.3 10*3/uL — ABNORMAL HIGH (ref 1.7–7.7)
Neutro Abs: 8.1 10*3/uL — ABNORMAL HIGH (ref 1.7–7.7)
Neutrophils Relative %: 90 % — ABNORMAL HIGH (ref 43–77)
Platelets: 169 10*3/uL (ref 150–400)
Platelets: 131 10*3/uL — ABNORMAL LOW (ref 150–400)
RBC: 4.67 MIL/uL (ref 3.87–5.11)
RBC: 4.07 MIL/uL (ref 3.87–5.11)
RDW: 13 % (ref 11.5–15.5)
RDW: 13 % (ref 11.5–15.5)
WBC: 16.3 10*3/uL — ABNORMAL HIGH (ref 4.0–10.5)
WBC: 9 10*3/uL (ref 4.0–10.5)

## 2013-12-29 LAB — COMPREHENSIVE METABOLIC PANEL
ALT: 25 U/L (ref 0–35)
ANION GAP: 16 — AB (ref 5–15)
AST: 54 U/L — AB (ref 0–37)
Albumin: 3.7 g/dL (ref 3.5–5.2)
Alkaline Phosphatase: 156 U/L — ABNORMAL HIGH (ref 39–117)
BUN: 34 mg/dL — AB (ref 6–23)
CO2: 25 mmol/L (ref 19–32)
Calcium: 12.1 mg/dL — ABNORMAL HIGH (ref 8.4–10.5)
Chloride: 99 mEq/L (ref 96–112)
Creatinine, Ser: 2.41 mg/dL — ABNORMAL HIGH (ref 0.50–1.10)
GFR calc Af Amer: 20 mL/min — ABNORMAL LOW (ref >90)
GFR calc non Af Amer: 17 mL/min — ABNORMAL LOW (ref >90)
Glucose, Bld: 192 mg/dL — ABNORMAL HIGH (ref 70–99)
Potassium: 3 mmol/L — ABNORMAL LOW (ref 3.5–5.1)
Sodium: 140 mmol/L (ref 135–145)
TOTAL PROTEIN: 7 g/dL (ref 6.0–8.3)
Total Bilirubin: 0.9 mg/dL (ref 0.3–1.2)

## 2013-12-29 LAB — I-STAT TROPONIN, ED
TROPONIN I, POC: 0.04 ng/mL (ref 0.00–0.08)
TROPONIN I, POC: 0.07 ng/mL (ref 0.00–0.08)

## 2013-12-29 LAB — I-STAT CHEM 8, ED
BUN: 35 mg/dL — ABNORMAL HIGH (ref 6–23)
Calcium, Ion: 1.45 mmol/L — ABNORMAL HIGH (ref 1.13–1.30)
Chloride: 101 mEq/L (ref 96–112)
Creatinine, Ser: 2.3 mg/dL — ABNORMAL HIGH (ref 0.50–1.10)
Glucose, Bld: 192 mg/dL — ABNORMAL HIGH (ref 70–99)
HEMATOCRIT: 44 % (ref 36.0–46.0)
HEMOGLOBIN: 15 g/dL (ref 12.0–15.0)
POTASSIUM: 3.1 mmol/L — AB (ref 3.5–5.1)
SODIUM: 140 mmol/L (ref 135–145)
TCO2: 24 mmol/L (ref 0–100)

## 2013-12-29 LAB — URINALYSIS, ROUTINE W REFLEX MICROSCOPIC
Bilirubin Urine: NEGATIVE
Glucose, UA: NEGATIVE mg/dL
KETONES UR: NEGATIVE mg/dL
Nitrite: NEGATIVE
PROTEIN: 100 mg/dL — AB
Specific Gravity, Urine: >1.03 — ABNORMAL HIGH (ref 1.005–1.030)
UROBILINOGEN UA: 0.2 mg/dL (ref 0.0–1.0)
pH: 5 (ref 5.0–8.0)

## 2013-12-29 LAB — TYPE AND SCREEN
ABO/RH(D): A POS
Antibody Screen: NEGATIVE

## 2013-12-29 LAB — PROTIME-INR
INR: 1.49 (ref 0.00–1.49)
PROTHROMBIN TIME: 18.2 s — AB (ref 11.6–15.2)

## 2013-12-29 LAB — TSH: TSH: 2.629 u[IU]/mL (ref 0.350–4.500)

## 2013-12-29 LAB — I-STAT CG4 LACTIC ACID, ED
Lactic Acid, Venous: 3.41 mmol/L — ABNORMAL HIGH (ref 0.5–2.2)
Lactic Acid, Venous: 4.49 mmol/L — ABNORMAL HIGH (ref 0.5–2.2)

## 2013-12-29 LAB — LACTIC ACID, PLASMA: Lactic Acid, Venous: 3.7 mmol/L — ABNORMAL HIGH (ref 0.5–2.2)

## 2013-12-29 LAB — TROPONIN I: TROPONIN I: 0.07 ng/mL — AB (ref <0.031)

## 2013-12-29 LAB — PREPARE RBC (CROSSMATCH)

## 2013-12-29 LAB — LIPASE, BLOOD: Lipase: 73 U/L — ABNORMAL HIGH (ref 11–59)

## 2013-12-29 MED ORDER — VANCOMYCIN HCL 500 MG IV SOLR: 500.0000 mg | INTRAVENOUS | Status: DC

## 2013-12-29 MED ORDER — LATANOPROST 0.005 % OP SOLN
1.0000 [drp] | Freq: Every day | OPHTHALMIC | Status: DC
Start: 2013-12-29 — End: 2013-12-30
  Administered 2013-12-29: 1 [drp] via OPHTHALMIC
  Filled 2013-12-29: qty 2.5

## 2013-12-29 MED ORDER — PIPERACILLIN-TAZOBACTAM IN DEX 2-0.25 GM/50ML IV SOLN
2.2500 g | Freq: Four times a day (QID) | INTRAVENOUS | Status: DC
  Administered 2013-12-29 – 2013-12-30 (×4): 2.25 g via INTRAVENOUS
  Filled 2013-12-29 (×7): qty 50

## 2013-12-29 MED ORDER — PANTOPRAZOLE SODIUM 40 MG IV SOLR
80.0000 mg | Freq: Once | INTRAVENOUS | Status: AC
  Administered 2013-12-29: 80 mg via INTRAVENOUS
  Filled 2013-12-29: qty 80

## 2013-12-29 MED ORDER — IOHEXOL 300 MG/ML  SOLN: 100.0000 mL | Freq: Once | INTRAMUSCULAR | Status: AC | PRN

## 2013-12-29 MED ORDER — LORAZEPAM 2 MG/ML IJ SOLN
0.5000 mg | INTRAMUSCULAR | Status: DC | PRN
  Administered 2013-12-30 – 2013-12-31 (×2): 0.5 mg via INTRAVENOUS
  Filled 2013-12-29 (×2): qty 1

## 2013-12-29 MED ORDER — PIPERACILLIN-TAZOBACTAM 3.375 G IVPB 30 MIN
3.3750 g | Freq: Once | INTRAVENOUS | Status: AC
  Administered 2013-12-29: 3.375 g via INTRAVENOUS
  Filled 2013-12-29: qty 50

## 2013-12-29 MED ORDER — MORPHINE SULFATE 2 MG/ML IJ SOLN
2.0000 mg | INTRAMUSCULAR | Status: DC | PRN
  Administered 2013-12-30 – 2013-12-31 (×3): 2 mg via INTRAVENOUS
  Filled 2013-12-29 (×3): qty 1

## 2013-12-29 MED ORDER — SODIUM CHLORIDE 0.9 % IV BOLUS (SEPSIS)
1000.0000 mL | Freq: Once | INTRAVENOUS | Status: AC
  Administered 2013-12-29: 1000 mL via INTRAVENOUS

## 2013-12-29 MED ORDER — ACETAMINOPHEN 650 MG RE SUPP: 650.0000 mg | Freq: Four times a day (QID) | RECTAL | Status: DC | PRN

## 2013-12-29 MED ORDER — ONDANSETRON HCL 4 MG PO TABS: 4.0000 mg | ORAL_TABLET | Freq: Four times a day (QID) | ORAL | Status: DC | PRN

## 2013-12-29 MED ORDER — SODIUM CHLORIDE 0.9 % IV SOLN
INTRAVENOUS | Status: AC
  Administered 2013-12-29 – 2013-12-30 (×3): via INTRAVENOUS

## 2013-12-29 MED ORDER — CETYLPYRIDINIUM CHLORIDE 0.05 % MT LIQD
7.0000 mL | Freq: Two times a day (BID) | OROMUCOSAL | Status: DC
  Administered 2013-12-29 – 2013-12-30 (×2): 7 mL via OROMUCOSAL

## 2013-12-29 MED ORDER — CHLORHEXIDINE GLUCONATE 0.12 % MT SOLN
15.0000 mL | Freq: Two times a day (BID) | OROMUCOSAL | Status: DC
  Administered 2013-12-29 – 2013-12-30 (×2): 15 mL via OROMUCOSAL
  Filled 2013-12-29 (×2): qty 15

## 2013-12-29 MED ORDER — ONDANSETRON HCL 4 MG/2ML IJ SOLN
4.0000 mg | Freq: Four times a day (QID) | INTRAMUSCULAR | Status: DC | PRN
Start: 2013-12-29 — End: 2013-12-31
  Administered 2013-12-29: 4 mg via INTRAVENOUS
  Filled 2013-12-29: qty 2

## 2013-12-29 MED ORDER — VANCOMYCIN HCL IN DEXTROSE 1-5 GM/200ML-% IV SOLN
1000.0000 mg | Freq: Once | INTRAVENOUS | Status: AC
  Administered 2013-12-29: 1000 mg via INTRAVENOUS
  Filled 2013-12-29: qty 200

## 2013-12-29 MED ORDER — ACETAMINOPHEN 325 MG PO TABS: 650.0000 mg | ORAL_TABLET | Freq: Four times a day (QID) | ORAL | Status: DC | PRN

## 2013-12-29 NOTE — ED Notes (Signed)
Notified MD and RN of i-stat lactic acid of 4.49

## 2013-12-29 NOTE — ED Notes (Signed)
Pt. Is from home and is c/o weakness. Pt. Fell at home and family called EMS. On EMS arrival pt. HR is in the 40s. En route to hospital pt. Vomited and had diarrhea.

## 2013-12-29 NOTE — H&P (Signed)
[facility] History and Physical  Leslie Phoancy L Brooks MVH:[id number] DOB: 03/29/1927 DOA: 12/27/2013  Referring physician: ER physician. PCP:  Duane Lopeoss, Alan, MD   Chief Complaint: Abdominal pain and weakness.  HPI: [name] is a 80 y.o. female with history of peripheral vascular disease status post I to femoral bypass, hypertension, paroxysmal atrial fibrillation, hypertension was brought to the ER after patient was complaining of abdominal pain with bloody diarrhea. As per patient's family patient was doing fine until last evening. Later patient started developing abdominal pain with bloody diarrhea. Patient also had couple episodes of nausea and vomiting. EMS was called by the patient and patient was brought to the ER. In the ER patient was found to be hypotensive bradycardic and hypothermic. On exam patient has left lower quadrant tenderness. And a CT abdomen and pelvis was done which shows features consistent with colitis involving the ascending transverse and sigmoid area. Patient's lactic acid was around 4. Patient did not have any chest pain or shortness of breath. Patient has been placed on warm blankets answer on IV fluid resuscitation. Patient has been admitted for further management of sepsis most likely secondary to intra-abdominal source. Patient's family states that patient has been having some abdominal pain off and on for last few weeks and also has been having difficulty to control blood pressure and patient's PCP has recently changed antihypertensive medications to Lotrel.  Review of Systems: As presented in the history of presenting illness, rest negative.  Past Medical History  Diagnosis Date  . Hypertension   . Arthritis Jan. 2011  . Hyperlipidemia   . Heart murmur   . History of nephrolithiasis 1990  and   1995    s/p Lithotripsy  . Cerebrovascular occlusion April 03, 1998    Extracranial with Left internal Carotid Artery Stenosis  . COPD (chronic obstructive pulmonary  disease) March  2000  . Myocardial infarction  1997  . Percutaneous transluminal coronary angioplasty status May 1997    with stenting of Right Coronary Artery  . Bilateral claudication of lower limb March 2000  . Carotid artery occlusion 04/03/1998    Bruit bilateral  . 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 Aortoiliac occlusive disease March 2000    Diffuse  . Anemia June 07, 2003   Past Surgical History  Procedure Laterality Date  . Kidney stones    . Colon surgery  1997    Polypectomy benign polyps  . Pr bypass graft othr,aortobifemoral  April 03, 1998    with Right renal artery  BPG  by Dr. Ashley Royalty. Dickson  . Carotid endarterectomy  Sept. 1998    LEFT cea  . Carotid endarterectomy  May 25, 2008    RIGHT cea   Social History:  reports that she quit smoking about 21 years ago. Her smoking use included Cigarettes. She smoked 0.00 packs per day. She has never used smokeless tobacco. She reports that she does not drink alcohol or use illicit drugs. Where does patient live home. Can patient participate in ADLs? Yes.  Allergies  Allergen Reactions  . Codeine Itching and Rash  . Shellfish Allergy Itching and Rash    Family History:  Family History  Problem Relation Age of Onset  . Stroke Mother   . Heart attack Father   . Leukemia Brother   . Cancer Maternal Grandmother     Colon      Prior to Admission medications   Medication Sig Start Date End Date  Taking? Authorizing Provider  amLODipine-benazepril (LOTREL) 5-20 MG per capsule Take 1 capsule by mouth daily.   Yes Historical Provider, MD  aspirin 81 MG tablet Take 81 mg by mouth daily.   Yes Historical Provider, MD  Calcium Carbonate-Vitamin D (CALTRATE 600+D PO) Take 1 tablet by mouth daily.   Yes Historical Provider, MD  Cholecalciferol (VITAMIN D PO) Take 1 tablet by mouth daily.   Yes Historical Provider, MD  Ferrous Sulfate (IRON) 325 (65 FE) MG TABS Take 1 tablet by mouth daily.    Yes Historical Provider, MD   furosemide (LASIX) 20 MG tablet Take 40 mg by mouth daily.   Yes Historical Provider, MD  LUMIGAN 0.01 % SOLN Place 1 drop into both eyes at bedtime. 2014/01/03  Yes Historical Provider, MD  metoprolol (LOPRESSOR) 100 MG tablet Take 100 mg by mouth 2 (two) times daily.   Yes Historical Provider, MD  Omega-3 Fatty Acids (FISH OIL PO) Take 1 capsule by mouth daily.   Yes Historical Provider, MD  potassium chloride SA (K-DUR,KLOR-CON) 20 MEQ tablet Take 20 mEq by mouth daily.   Yes Historical Provider, MD  simvastatin (ZOCOR) 20 MG tablet Take 20 mg by mouth daily at 6 PM.   Yes Historical Provider, MD    Physical Exam: Filed Vitals:   12/29/13 0245 12/29/13 0300 12/29/13 0315 12/29/13 0330  BP: 103/40 110/39 122/69 96/24  Pulse: 50 50 55 48  Temp:      TempSrc:      Resp: 21 18 21 26   Height:      Weight:      SpO2: 97% 98% 100% 100%     General:  Poorly built and nourished.  Eyes: Anicteric. No pallor.  ENT: No discharge from the ears eyes nose and mouth.  Neck: No mass felt.  Cardiovascular: S1-S2 heard.  Respiratory: No rhonchi or crepitations.  Abdomen: Soft tenderness in the left lower quadrant.  Skin: No rash.  Musculoskeletal: No edema.  Psychiatric: Appears normal.  Neurologic: Alert awake oriented to time place and person. Moves all extremities.  Labs on Admission:  Basic Metabolic Panel:  Recent Labs Lab 12/29/13 0016 12/29/13 0033  NA 140 140  K 3.0* 3.1*  CL 99 101  CO2 25  --   GLUCOSE 192* 192*  BUN 34* 35*  CREATININE 2.41* 2.30*  CALCIUM 12.1*  --    Liver Function Tests:  Recent Labs Lab 12/29/13 0016  AST 54*  ALT 25  ALKPHOS 156*  BILITOT 0.9  PROT 7.0  ALBUMIN 3.7    Recent Labs Lab 12/29/13 0016  LIPASE 73*   No results for input(s): AMMONIA in the last 168 hours. CBC:  Recent Labs Lab 12/29/13 0016 12/29/13 0033  WBC 16.3*  --   NEUTROABS 13.3*  --   HGB 14.0 15.0  HCT 41.7 44.0  MCV 89.3  --   PLT 169  --     Cardiac Enzymes: No results for input(s): CKTOTAL, CKMB, CKMBINDEX, TROPONINI in the last 168 hours.  BNP (last 3 results) No results for input(s): PROBNP in the last 8760 hours. CBG: No results for input(s): GLUCAP in the last 168 hours.  Radiological Exams on Admission: Dg Chest 2 View  12/29/2013   CLINICAL DATA:  Acute onset of shortness of breath and weakness. Initial encounter.  EXAM: CHEST  2 VIEW  COMPARISON:  Chest radiograph from 05/13/2010  FINDINGS: The lungs are hyperexpanded, with flattening of the hemidiaphragms, compatible with COPD. There is  mild elevation of the left hemidiaphragm. There is no evidence of focal opacification, pleural effusion or pneumothorax.  The heart is mildly enlarged. No acute osseous abnormalities are seen. External pacing pads are noted. A metallic device is seen overlying the mediastinum. Clips are seen overlying the left side of the neck.  IMPRESSION: No acute cardiopulmonary process seen. Mild elevation of the left hemidiaphragm. Findings of COPD and mild cardiomegaly.   Electronically Signed   By: Roanna Raider M.D.   On: 12/29/2013 01:26   Ct Head Wo Contrast  12/29/2013   CLINICAL DATA:  Vomiting  EXAM: CT HEAD WITHOUT CONTRAST  TECHNIQUE: Contiguous axial images were obtained from the base of the skull through the vertex without intravenous contrast.  COMPARISON:  04/24/2010  FINDINGS: Mild cortical volume loss noted with proportional ventricular prominence. Areas of periventricular white matter hypodensity are most compatible with small vessel ischemic change. No acute hemorrhage, infarct, or mass lesion is identified. Remote left external capsule infarct reidentified. No midline shift. Minimal ethmoid mucoperiosteal thickening. No skull fracture.  IMPRESSION: Chronic findings as above, no acute intracranial finding.   Electronically Signed   By: Christiana Pellant M.D.   On: 12/29/2013 02:31   Ct Abdomen Pelvis W Contrast  12/29/2013   CLINICAL  DATA:  Hematochezia and diarrhea, acute onset. Initial encounter.  EXAM: CT ABDOMEN AND PELVIS WITH CONTRAST  TECHNIQUE: Multidetector CT imaging of the abdomen and pelvis was performed using the standard protocol following bolus administration of intravenous contrast.  CONTRAST:  80 mL of Omnipaque 300 IV contrast  COMPARISON:  Lumbar spine radiograph performed 05/31/2013, and renal ultrasound from 11/03/2008  FINDINGS: Mild nonspecific peribronchial thickening is noted at the lung bases. A small pericardial effusion is noted.  The liver and spleen are unremarkable in appearance. The gallbladder is within normal limits. The pancreas and right adrenal gland are unremarkable. A nonspecific 2.0 cm left adrenal mass is noted.  There is severe chronic left renal atrophy. There is slight right renal atrophy. A massive cyst is noted at the lower pole of the right kidney, measuring approximately 10.9 cm. Chronic severe left-sided hydronephrosis is noted, apparently reflecting a stricture at the proximal left ureter.  No definite renal or ureteral stones are identified.  There is a somewhat unusual pattern of wall enhancement with regard to the visualized small bowel, which may reflect an underlying infectious or inflammatory process. The small bowel is distended up to 3.0 cm, borderline normal in diameter, without definite evidence of obstruction.  The stomach is within normal limits. No acute vascular abnormalities are seen. The patient is status post aortobifemoral bypass graft. The bypass graft appears fully patent. 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 There is slight ectasia of the abdominal aorta proximal to the bypass graft, without evidence of aneurysmal dilatation. Scattered calcific atherosclerotic disease is seen along the proximal abdominal aorta, with mild associated intramural thrombus, somewhat irregular in appearance.  The appendix is normal in caliber and contains trace air,  without evidence for appendicitis.  There is suggestion of mild diffuse wall thickening and associated mild soft tissue inflammation along the ascending and transverse colon, with perhaps minimal wall thickening along the distal sigmoid colon. This may reflect a segmental colitis, either infectious or inflammatory in nature. There is no evidence of ischemia. No definite focal source of bleeding is identified on this study.  The bladder is not definitely seen. The uterus contains multiple small calcified fibroids, and is otherwise unremarkable. The ovaries  are difficult to fully assess due to surrounding bowel structures. No inguinal lymphadenopathy is seen.  No acute osseous abnormalities are identified. There are healed fractures of the right superior and inferior pubic rami. There is chronic bilateral hip joint space narrowing, more severe on the right, with associated sclerotic change and osteophyte formation on the right side. Nonspecific sclerotic change and expansion are noted at the right sacroiliac joint.  There is minimal grade 1 anterolisthesis of L3 on L4, reflecting underlying facet disease.  IMPRESSION: 1. Suggestion of mild diffuse wall thickening and soft tissue inflammation along the ascending and transverse colon, with perhaps minimal wall thickening along the distal sigmoid colon. This raises concern for mild segmental colitis, either infectious or inflammatory in nature. This could explain the patient's hematochezia. 2. Somewhat unusual pattern of wall enhancement with regard to the visualized small bowel, which may reflect an underlying infectious or inflammatory process. Small bowel is borderline normal in diameter, without evidence of bowel obstruction. 3. Scattered calcific atherosclerotic disease along the proximal abdominal aorta, with mild irregular appearing intramural thrombus. The aortobifemoral bypass graft remains patent. 4. Severe chronic left-sided hydronephrosis, apparently  reflecting a stricture at the proximal left ureter. Severe left renal atrophy noted. 5. Very large right renal cyst noted. 6. Nonspecific 2.0 cm left adrenal mass noted. Adrenal protocol MRI or CT could be considered for further evaluation, when and as deemed clinically appropriate. 7. Small pericardial effusion noted. 8. Nonspecific mild peribronchial thickening noted at the lung bases. 9. Chronic bilateral hip joint space narrowing, more severe on the right, with associated right-sided sclerotic change and osteophyte formation.   Electronically Signed   By: Roanna RaiderJeffery  Chang M.D.   On: 12/29/2013 02:36    EKG: Independently reviewed. Sinus bradycardia.  Assessment/Plan Principal Problem:   Sepsis Active Problems:   Acute renal failure   Bradycardia   Hypercalcemia   Colitis   1. Sepsis secondary to colitis - at this time suspect ischemic colitis and I have consulted on-call surgeon Dr. Lindie SpruceWyatt. Patient will be kept nothing by mouth and continue with IV fluids and antibiotics. I have also consult pulmonary critical care. Follow cultures. 2. Acute renal failure probably secondary to #1 - continue with aggressive hydration and hold antihypertensives as patient is hypotensive and bradycardic. Closely follow intake output and metabolic panel. 3. Hypotension and bradycardia and hypothermia - probably secondary to #1. Hold antihypertensives. See #1 and 2. 4. Hypercalcemia from dehydration - continue hydration and closely follow metabolic panel. 5. History of peripheral artery disease status post right total femoral bypass. 6. History of hypertension.  I had extensive discussion with patient's sons at the bedside. I have explained to the patient's sons that patient is critically ill at this time.   DVT Prophylaxis SCDs due to active bleed.  Code Status: Full code at this time.  Family Communication: Patient's sons at the bedside.  Disposition Plan: Admit to inpatient.    Jasmaine Rochel  N. [facility] Pager [phone].  If 7PM-7AM, please contact night-coverage [email or web address] Password [email or web address] 12/29/2013, 3:55 AM

## 2013-12-29 NOTE — Progress Notes (Signed)
ANTIBIOTIC CONSULT NOTE - INITIAL  Pharmacy Consult for Vancocin and Zosyn Indication: sepsis w/ intra-abdominal source  Allergies  Allergen Reactions  . Codeine Itching and Rash  . Shellfish Allergy Itching and Rash    Patient Measurements: Height: 5' (152.4 cm) Weight: 97 lb (43.999 kg) IBW/kg (Calculated) : 45.5  Vital Signs: Temp: 98.6 F (37 C) (12/24 0410) Temp Source: Rectal (12/24 0410) BP: 105/25 mmHg (12/24 0445) Pulse Rate: 49 (12/24 0445)  Labs:  Recent Labs  12/29/13 0016 12/29/13 0033 12/29/13 0351  WBC 16.3*  --  9.0  HGB 14.0 15.0 12.3  PLT 169  --  131*  CREATININE 2.41* 2.30*  --    Estimated Creatinine Clearance: 12.6 mL/min (by C-G formula based on Cr of 2.3).  Medical History: Past Medical History  Diagnosis Date  . Hypertension   . Arthritis Jan. 2011  . Hyperlipidemia   . Heart murmur   . History of nephrolithiasis 1990  and   1995    s/p Lithotripsy  . Cerebrovascular occlusion April 03, 1998    Extracranial with Left internal Carotid Artery Stenosis  . COPD (chronic obstructive pulmonary disease) March  2000  . Myocardial infarction  1997  . Percutaneous transluminal coronary angioplasty status May 1997    with stenting of Right Coronary Artery  . Bilateral claudication of lower limb March 2000  . Carotid artery occlusion 04/03/1998    Bruit bilateral  . 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 Aortoiliac occlusive disease March 2000    Diffuse  . Anemia June 07, 2003     Assessment: 80yo female c/o weakness and N/V, found to be hypotensive w/ abnormal UA and leukocytosis, lactate elevated, CT concerning for infection, to begin IV ABX.  Goal of Therapy:  Vancomycin trough level 15-20 mcg/ml  Plan:  Rec'd vanc 1g and Zosyn 3.375g IV in ED; will continue with vancomycin 500mg  IV Q48H and Zosyn 2.25g IV Q6H and monitor CBC, Cx, levels prn.  Vernard GamblesVeronda Jakyrah Holladay, PharmD, BCPS  12/29/2013,5:37 AM

## 2013-12-29 NOTE — Progress Notes (Signed)
PROGRESS NOTE  Fayette Phoancy L Brager WUJ:[id number] DOB: 08/21/1927 DOA: 2013-05-04 PCP:  Duane Lopeoss, Alan, MD  HPI/Recap of past 24 hours: Patient is an 80 year old female past mental history of hypertension and hyperlipidemia in her usual health when she started having severe diarrhea last night and came into the emergency room with weakness. Patient found to have sepsis from ischemic bowel. Given degree of involvement and advanced age, critical-care and Gen. surgery agreed that she would not be an operative candidate. This was discussed with the family and patient was managed conservatively with IV fluids and antibiotics after being admitted to the hospitalist service on the morning of 12/24.  Patient seen down emergency room. Complains of some abdominal pain, she is very tired. After discussion with patient's son, patient is a DO NOT RESUSCITATE now. In addition, possibly will BE Comfort Care fully  Assessment/Plan: Principal Problem:   Sepsis with secondary renal failure from ischemic bowel disease secondary to hypotension caused by diarrhea: Managing conservatively with IV fluids and antibiotics plus medication for pain and nausea    Bradycardia   Hypercalcemia: Secondary to dehydration     Essential hypertension: Antihypertensives on hold secondary to hypotension   Hyperlipidemia: Statin on hold   Code Status: DO NOT RESUSCITATE  Family Communication: Plan discussed with one of patient's sons and his family  Disposition Plan: Patient is likely terminal   Consultants:  General surgery  Critical care  Procedures:  None  Antibiotics:  IV Zosyn and 12/24-present  IV vancomycin 12/24-present   Objective: BP 167/155 mmHg  Pulse 119  Temp(Src) 98.7 F (37.1 C) (Axillary)  Resp 22  Ht 5' (1.524 m)  Wt 43.999 kg (97 lb)  BMI 18.94 kg/m2  SpO2 95%  Intake/Output Summary (Last 24 hours) at 12/29/13 1655 Last data filed at 12/29/13 1616  Gross per 24 hour  Intake      0 ml    Output      0 ml  Net      0 ml   Filed Weights   12/29/13 0016  Weight: 43.999 kg (97 lb)    Exam:   General:  Alert and oriented 2, mild distress secondary to pain  Cardiovascular: Regular rate and rhythm, borderline bradycardia  Respiratory: Clear to auscultation bilaterally  Abdomen: Soft, some guarding especially in left lower quadrant, nondistended, cannot appreciate bowel sounds  Musculoskeletal: Trace edema   Data Reviewed: Basic Metabolic Panel:  Recent Labs Lab 12/29/13 0016 12/29/13 0033  NA 140 140  K 3.0* 3.1*  CL 99 101  CO2 25  --   GLUCOSE 192* 192*  BUN 34* 35*  CREATININE 2.41* 2.30*  CALCIUM 12.1*  --    Liver Function Tests:  Recent Labs Lab 12/29/13 0016  AST 54*  ALT 25  ALKPHOS 156*  BILITOT 0.9  PROT 7.0  ALBUMIN 3.7    Recent Labs Lab 12/29/13 0016  LIPASE 73*   No results for input(s): AMMONIA in the last 168 hours. CBC:  Recent Labs Lab 12/29/13 0016 12/29/13 0033 12/29/13 0351  WBC 16.3*  --  9.0  NEUTROABS 13.3*  --  8.1*  HGB 14.0 15.0 12.3  HCT 41.7 44.0 36.8  MCV 89.3  --  90.4  PLT 169  --  131*   Cardiac Enzymes:    Recent Labs Lab 12/29/13 0402  TROPONINI 0.07*   BNP (last 3 results) No results for input(s): PROBNP in the last 8760 hours. CBG: No results for input(s): GLUCAP in the  last 168 hours.  No results found for this or any previous visit (from the past 240 hour(s)).   Studies: No results found.  Scheduled Meds: . latanoprost  1 drop Both Eyes QHS  . piperacillin-tazobactam (ZOSYN)  IV  2.25 g Intravenous Q6H  . [START ON 12/26/2013] vancomycin  500 mg Intravenous Q48H    Continuous Infusions: . sodium chloride 100 mL/hr at 12/29/13 1623     Time spent: 25 minutes  Hollice EspyKRISHNAN,Shalee Paolo K  [facility] Pager [phone]. If 7PM-7AM, please contact night-coverage at [email or web address], password [email or web address] 12/29/2013, 4:55 PM  LOS: 1 day

## 2013-12-29 NOTE — Consult Note (Signed)
Reason for Consult: Bloody diarrhea, colitis on CT, peritonitis Referring Physician: [name] is an 80 y.o. female.  HPI: 80 year old female, previously functional and independent, changed her medication for BP control yesterday and developed profound bradycardia associated with nausea, vomiting and diarrheas.  She subsequently developed bloody diarrhea.  C"T" scan shoed ascending, transvers ans likely sigmoid colitis along with likely ischemic small bowel.  Her lactic acid level was > 3 and she has been bradycardic into the 30's.  She has abdominal pain.  Prior to her changes this evening she was doing well and cooking cakes.  Acute onset.  She is a former smoker and has a history of an AoBifem, and bilateral CEAs.  Past Medical History  Diagnosis Date  . Hypertension   . Arthritis Jan. 2011  . Hyperlipidemia   . Heart murmur   . History of nephrolithiasis 1990  and   1995    s/p Lithotripsy  . Cerebrovascular occlusion April 03, 1998    Extracranial with Left internal Carotid Artery Stenosis  . COPD (chronic obstructive pulmonary disease) March  2000  . Myocardial infarction  1997  . Percutaneous transluminal coronary angioplasty status May 1997    with stenting of Right Coronary Artery  . Bilateral claudication of lower limb March 2000  . Carotid artery occlusion 04/03/1998    Bruit bilateral  . 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 Aortoiliac occlusive disease March 2000    Diffuse  . Anemia June 07, 2003    Past Surgical History  Procedure Laterality Date  . Kidney stones    . Colon surgery  1997    Polypectomy benign polyps  . Pr bypass graft othr,aortobifemoral  April 03, 1998    with Right renal artery  BPG  by Dr. Rosalia Hammers  . Carotid endarterectomy  Sept. 1998    LEFT cea  . Carotid endarterectomy  May 25, 2008    RIGHT cea    Family History  Problem Relation Age of Onset  . Stroke Mother   . Heart attack Father   . Leukemia Brother    . Cancer Maternal Grandmother     Colon    Social History:  reports that she quit smoking about 21 years ago. Her smoking use included Cigarettes. She smoked 0.00 packs per day. She has never used smokeless tobacco. She reports that she does not drink alcohol or use illicit drugs.  Allergies:  Allergies  Allergen Reactions  . Codeine Itching and Rash  . Shellfish Allergy Itching and Rash    Medications: I have reviewed the patient's current medications.  Results for orders placed or performed during the hospital encounter of 12/16/2013 (from the past 48 hour(s))  POC occult blood, ED RN will collect     Status: Abnormal   Collection Time: 12/13/2013 11:56 PM  Result Value Ref Range   Fecal Occult Bld POSITIVE (A) NEGATIVE  CBC with Differential     Status: Abnormal   Collection Time: 12/29/13 12:16 AM  Result Value Ref Range   WBC 16.3 (H) 4.0 - 10.5 K/uL   RBC 4.67 3.87 - 5.11 MIL/uL   Hemoglobin 14.0 12.0 - 15.0 g/dL   HCT 41.7 36.0 - 46.0 %   MCV 89.3 78.0 - 100.0 fL   MCH 30.0 26.0 - 34.0 pg   MCHC 33.6 30.0 - 36.0 g/dL   RDW 13.0 11.5 - 15.5 %   Platelets 169 150 -  400 K/uL   Neutrophils Relative % 82 (H) 43 - 77 %   Neutro Abs 13.3 (H) 1.7 - 7.7 K/uL   Lymphocytes Relative 14 12 - 46 %   Lymphs Abs 2.2 0.7 - 4.0 K/uL   Monocytes Relative 4 3 - 12 %   Monocytes Absolute 0.7 0.1 - 1.0 K/uL   Eosinophils Relative 0 0 - 5 %   Eosinophils Absolute 0.1 0.0 - 0.7 K/uL   Basophils Relative 0 0 - 1 %   Basophils Absolute 0.0 0.0 - 0.1 K/uL  Comprehensive metabolic panel     Status: Abnormal   Collection Time: 12/29/13 12:16 AM  Result Value Ref Range   Sodium 140 135 - 145 mmol/L    Comment: Please note change in reference range.   Potassium 3.0 (L) 3.5 - 5.1 mmol/L    Comment: Please note change in reference range.   Chloride 99 96 - 112 mEq/L   CO2 25 19 - 32 mmol/L   Glucose, Bld 192 (H) 70 - 99 mg/dL   BUN 34 (H) 6 - 23 mg/dL   Creatinine, Ser 2.41 (H) 0.50 - 1.10  mg/dL   Calcium 12.1 (H) 8.4 - 10.5 mg/dL   Total Protein 7.0 6.0 - 8.3 g/dL   Albumin 3.7 3.5 - 5.2 g/dL   AST 54 (H) 0 - 37 U/L   ALT 25 0 - 35 U/L   Alkaline Phosphatase 156 (H) 39 - 117 U/L   Total Bilirubin 0.9 0.3 - 1.2 mg/dL   GFR calc non Af Amer 17 (L) >90 mL/min   GFR calc Af Amer 20 (L) >90 mL/min    Comment: (NOTE) The eGFR has been calculated using the CKD EPI equation. This calculation has not been validated in all clinical situations. eGFR's persistently <90 mL/min signify possible Chronic Kidney Disease.    Anion gap 16 (H) 5 - 15  Lipase, blood     Status: Abnormal   Collection Time: 12/29/13 12:16 AM  Result Value Ref Range   Lipase 73 (H) 11 - 59 U/L  Protime-INR     Status: Abnormal   Collection Time: 12/29/13 12:16 AM  Result Value Ref Range   Prothrombin Time 18.2 (H) 11.6 - 15.2 seconds   INR 1.49 0.00 - 1.49  Type and screen     Status: None   Collection Time: 12/29/13 12:17 AM  Result Value Ref Range   ABO/RH(D) A POS    Antibody Screen NEG    Sample Expiration 01/01/2014   Prepare RBC     Status: None   Collection Time: 12/29/13 12:17 AM  Result Value Ref Range   Order Confirmation ORDER PROCESSED BY BLOOD BANK   I-stat troponin, ED     Status: None   Collection Time: 12/29/13 12:31 AM  Result Value Ref Range   Troponin i, poc 0.04 0.00 - 0.08 ng/mL   Comment 3            Comment: Due to the release kinetics of cTnI, a negative result within the first hours of the onset of symptoms does not rule out myocardial infarction with certainty. If myocardial infarction is still suspected, repeat the test at appropriate intervals.   I-stat chem 8, ed     Status: Abnormal   Collection Time: 12/29/13 12:33 AM  Result Value Ref Range   Sodium 140 135 - 145 mmol/L   Potassium 3.1 (L) 3.5 - 5.1 mmol/L   Chloride 101 96 - 112  mEq/L   BUN 35 (H) 6 - 23 mg/dL   Creatinine, Ser 6.60 (H) 0.50 - 1.10 mg/dL   Glucose, Bld 600 (H) 70 - 99 mg/dL   Calcium,  Ion 4.59 (H) 1.13 - 1.30 mmol/L   TCO2 24 0 - 100 mmol/L   Hemoglobin 15.0 12.0 - 15.0 g/dL   HCT 97.7 41.4 - 23.9 %  I-Stat CG4 Lactic Acid, ED     Status: Abnormal   Collection Time: 12/29/13 12:33 AM  Result Value Ref Range   Lactic Acid, Venous 4.49 (H) 0.5 - 2.2 mmol/L  Urinalysis, Routine w reflex microscopic     Status: Abnormal   Collection Time: 12/29/13  1:51 AM  Result Value Ref Range   Color, Urine YELLOW YELLOW   APPearance CLOUDY (A) CLEAR   Specific Gravity, Urine >1.030 (H) 1.005 - 1.030   pH 5.0 5.0 - 8.0   Glucose, UA NEGATIVE NEGATIVE mg/dL   Hgb urine dipstick MODERATE (A) NEGATIVE   Bilirubin Urine NEGATIVE NEGATIVE   Ketones, ur NEGATIVE NEGATIVE mg/dL   Protein, ur 532 (A) NEGATIVE mg/dL   Urobilinogen, UA 0.2 0.0 - 1.0 mg/dL   Nitrite NEGATIVE NEGATIVE   Leukocytes, UA TRACE (A) NEGATIVE  Urine microscopic-add on     Status: Abnormal   Collection Time: 12/29/13  1:51 AM  Result Value Ref Range   Squamous Epithelial / LPF RARE RARE   WBC, UA 0-2 <3 WBC/hpf   RBC / HPF 3-6 <3 RBC/hpf   Bacteria, UA MANY (A) RARE   Urine-Other MICROSCOPIC EXAM PERFORMED ON UNCONCENTRATED URINE     Comment: AMORPHOUS URATES/PHOSPHATES  CBC with Differential     Status: Abnormal   Collection Time: 12/29/13  3:51 AM  Result Value Ref Range   WBC 9.0 4.0 - 10.5 K/uL   RBC 4.07 3.87 - 5.11 MIL/uL   Hemoglobin 12.3 12.0 - 15.0 g/dL    Comment: REPEATED TO VERIFY   HCT 36.8 36.0 - 46.0 %   MCV 90.4 78.0 - 100.0 fL   MCH 30.2 26.0 - 34.0 pg   MCHC 33.4 30.0 - 36.0 g/dL   RDW 02.3 34.3 - 56.8 %   Platelets 131 (L) 150 - 400 K/uL   Neutrophils Relative % 90 (H) 43 - 77 %   Neutro Abs 8.1 (H) 1.7 - 7.7 K/uL   Lymphocytes Relative 6 (L) 12 - 46 %   Lymphs Abs 0.5 (L) 0.7 - 4.0 K/uL   Monocytes Relative 4 3 - 12 %   Monocytes Absolute 0.4 0.1 - 1.0 K/uL   Eosinophils Relative 0 0 - 5 %   Eosinophils Absolute 0.0 0.0 - 0.7 K/uL   Basophils Relative 0 0 - 1 %   Basophils  Absolute 0.0 0.0 - 0.1 K/uL  Troponin I     Status: Abnormal   Collection Time: 12/29/13  4:02 AM  Result Value Ref Range   Troponin I 0.07 (H) <0.031 ng/mL    Comment:        PERSISTENTLY INCREASED TROPONIN VALUES IN THE RANGE OF 0.04-0.49 ng/mL CAN BE SEEN IN:       -UNSTABLE ANGINA       -CONGESTIVE HEART FAILURE       -MYOCARDITIS       -CHEST TRAUMA       -ARRYHTHMIAS       -LATE PRESENTING MYOCARDIAL INFARCTION       -COPD   CLINICAL FOLLOW-UP RECOMMENDED. Please note change in reference  range.   TSH     Status: None   Collection Time: 12/29/13  4:02 AM  Result Value Ref Range   TSH 2.629 0.350 - 4.500 uIU/mL  I-Stat CG4 Lactic Acid, ED     Status: Abnormal   Collection Time: 12/29/13  4:12 AM  Result Value Ref Range   Lactic Acid, Venous 3.41 (H) 0.5 - 2.2 mmol/L    Dg Chest 2 View  12/29/2013   CLINICAL DATA:  Acute onset of shortness of breath and weakness. Initial encounter.  EXAM: CHEST  2 VIEW  COMPARISON:  Chest radiograph from 05/13/2010  FINDINGS: The lungs are hyperexpanded, with flattening of the hemidiaphragms, compatible with COPD. There is mild elevation of the left hemidiaphragm. There is no evidence of focal opacification, pleural effusion or pneumothorax.  The heart is mildly enlarged. No acute osseous abnormalities are seen. External pacing pads are noted. A metallic device is seen overlying the mediastinum. Clips are seen overlying the left side of the neck.  IMPRESSION: No acute cardiopulmonary process seen. Mild elevation of the left hemidiaphragm. Findings of COPD and mild cardiomegaly.   Electronically Signed   By: Garald Balding M.D.   On: 12/29/2013 01:26   Ct Head Wo Contrast  12/29/2013   CLINICAL DATA:  Vomiting  EXAM: CT HEAD WITHOUT CONTRAST  TECHNIQUE: Contiguous axial images were obtained from the base of the skull through the vertex without intravenous contrast.  COMPARISON:  04/24/2010  FINDINGS: Mild cortical volume loss noted with  proportional ventricular prominence. Areas of periventricular white matter hypodensity are most compatible with small vessel ischemic change. No acute hemorrhage, infarct, or mass lesion is identified. Remote left external capsule infarct reidentified. No midline shift. Minimal ethmoid mucoperiosteal thickening. No skull fracture.  IMPRESSION: Chronic findings as above, no acute intracranial finding.   Electronically Signed   By: Conchita Paris M.D.   On: 12/29/2013 02:31   Ct Abdomen Pelvis W Contrast  12/29/2013   CLINICAL DATA:  Hematochezia and diarrhea, acute onset. Initial encounter.  EXAM: CT ABDOMEN AND PELVIS WITH CONTRAST  TECHNIQUE: Multidetector CT imaging of the abdomen and pelvis was performed using the standard protocol following bolus administration of intravenous contrast.  CONTRAST:  80 mL of Omnipaque 300 IV contrast  COMPARISON:  Lumbar spine radiograph performed 05/31/2013, and renal ultrasound from 11/03/2008  FINDINGS: Mild nonspecific peribronchial thickening is noted at the lung bases. A small pericardial effusion is noted.  The liver and spleen are unremarkable in appearance. The gallbladder is within normal limits. The pancreas and right adrenal gland are unremarkable. A nonspecific 2.0 cm left adrenal mass is noted.  There is severe chronic left renal atrophy. There is slight right renal atrophy. A massive cyst is noted at the lower pole of the right kidney, measuring approximately 10.9 cm. Chronic severe left-sided hydronephrosis is noted, apparently reflecting a stricture at the proximal left ureter.  No definite renal or ureteral stones are identified.  There is a somewhat unusual pattern of wall enhancement with regard to the visualized small bowel, which may reflect an underlying infectious or inflammatory process. The small bowel is distended up to 3.0 cm, borderline normal in diameter, without definite evidence of obstruction.  The stomach is within normal limits. No acute  vascular abnormalities are seen. The patient is status post aortobifemoral bypass graft. The bypass graft appears fully patent. 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 There is slight ectasia of the abdominal aorta proximal to the bypass graft,  without evidence of aneurysmal dilatation. Scattered calcific atherosclerotic disease is seen along the proximal abdominal aorta, with mild associated intramural thrombus, somewhat irregular in appearance.  The appendix is normal in caliber and contains trace air, without evidence for appendicitis.  There is suggestion of mild diffuse wall thickening and associated mild soft tissue inflammation along the ascending and transverse colon, with perhaps minimal wall thickening along the distal sigmoid colon. This may reflect a segmental colitis, either infectious or inflammatory in nature. There is no evidence of ischemia. No definite focal source of bleeding is identified on this study.  The bladder is not definitely seen. The uterus contains multiple small calcified fibroids, and is otherwise unremarkable. The ovaries are difficult to fully assess due to surrounding bowel structures. No inguinal lymphadenopathy is seen.  No acute osseous abnormalities are identified. There are healed fractures of the right superior and inferior pubic rami. There is chronic bilateral hip joint space narrowing, more severe on the right, with associated sclerotic change and osteophyte formation on the right side. Nonspecific sclerotic change and expansion are noted at the right sacroiliac joint.  There is minimal grade 1 anterolisthesis of L3 on L4, reflecting underlying facet disease.  IMPRESSION: 1. Suggestion of mild diffuse wall thickening and soft tissue inflammation along the ascending and transverse colon, with perhaps minimal wall thickening along the distal sigmoid colon. This raises concern for mild segmental colitis, either infectious or inflammatory in nature.  This could explain the patient's hematochezia. 2. Somewhat unusual pattern of wall enhancement with regard to the visualized small bowel, which may reflect an underlying infectious or inflammatory process. Small bowel is borderline normal in diameter, without evidence of bowel obstruction. 3. Scattered calcific atherosclerotic disease along the proximal abdominal aorta, with mild irregular appearing intramural thrombus. The aortobifemoral bypass graft remains patent. 4. Severe chronic left-sided hydronephrosis, apparently reflecting a stricture at the proximal left ureter. Severe left renal atrophy noted. 5. Very large right renal cyst noted. 6. Nonspecific 2.0 cm left adrenal mass noted. Adrenal protocol MRI or CT could be considered for further evaluation, when and as deemed clinically appropriate. 7. Small pericardial effusion noted. 8. Nonspecific mild peribronchial thickening noted at the lung bases. 9. Chronic bilateral hip joint space narrowing, more severe on the right, with associated right-sided sclerotic change and osteophyte formation.   Electronically Signed   By: Garald Balding M.D.   On: 12/29/2013 02:36    Review of Systems  Constitutional: Negative for fever and chills.  Gastrointestinal: Positive for nausea, vomiting, abdominal pain, diarrhea and blood in stool.  Genitourinary: Positive for dysuria.  Neurological: Positive for weakness.  All other systems reviewed and are negative.  Blood pressure 96/28, pulse 47, temperature 98.6 F (37 C), temperature source Rectal, resp. rate 35, height 5' (1.524 m), weight 43.999 kg (97 lb), SpO2 97 %. Physical Exam  Vitals reviewed. Constitutional: She is oriented to person, place, and time.  Cachectic, family notes recent weight loss  HENT:  Head: Normocephalic and atraumatic.  Eyes: Conjunctivae and EOM are normal. Pupils are equal, round, and reactive to light.  Cardiovascular: Regular rhythm and normal heart sounds.  Bradycardia present.    Respiratory: Effort normal and breath sounds normal.  GI: Soft. She exhibits distension. She exhibits no fluid wave, no abdominal bruit and no pulsatile midline mass. Bowel sounds are decreased. There is generalized tenderness. There is rebound and guarding. There is no rigidity.  Genitourinary: Guaiac positive stool.  Grossly bloody stools  Musculoskeletal: Normal range of  motion.  Neurological: She is alert and oriented to person, place, and time.  Skin: Skin is dry. There is pallor.    Assessment/Plan: Likely ischemic, and possibly infarcted bowel including multiple segments of colon and small bowel.  I spke candidly with the son and daughter-in-law about goals of care with the likelihood that major operative intervention may be necessary.  She very well could end up with an open abdomen, a colostomy, or an ileostomy.  The family is leaning towards palliative care.  I believe this would be best under the circumstances that she would have a very difficult time recovering from a major operation to get back to her prior levvel of function.  [name], JAY 12/29/2013, 6:06 AM

## 2013-12-29 NOTE — Consult Note (Signed)
Name: [name] MRN: [id number] DOB: 09/28/1927    ADMISSION DATE:  01/03/2014 CONSULTATION DATE:  12/29/2013  REFERRING MD :  Toniann FailKakrakandy  CHIEF COMPLAINT:  Hypotension, Bradycardia  BRIEF PATIENT DESCRIPTION: 80 y.o. F brought to ED 12/23 with weakness, vomiting, diarrhea, LLQ pain.  In ED, was hypotensive and bradycardic.  CT abdomen suggestive of colitis.  PCCM consulted for assistance with medical management.  SIGNIFICANT EVENTS  12/24 - admit  STUDIES:  CT abd / pelv 12/24 >>> diffuse wall thickening of ascending and transverse colon raising concern for mild segmental colitis.  Severe chronic left sided hydronephrosis, apparently reflecting stricture in left ureter, very large right renal cyst, 2cm left adrenal mass, small pericardial effusion. CT head 12/24 >>> no acute findings.   HISTORY OF PRESENT ILLNESS:  [name] is an 80 y.o. F with PMH as outlined below.  She [facility] ED on 12/23 with weakness, vomiting, diarrhea, LLQ pain.  She was in her USOH up until earlier that evening when symptoms began.  Son states she was actually up baking cakes for christmas afternoon of 12/23.  She saw PCP 3 days ago and he reportedly started her on metoprolol.  She took it for first time evening of presentation, a few hours prior to symptom onset. In ED, she was noted to be hypotensive and bradycardic, and with mild lactic acidosis.  Pt also had one dark black bowel movement that was heme positive.  CT abdomen / pelvis also demonstrated mild diffuse wall thickening and soft tissue inflammation along ascending and transverse colon, raising concern for colitis.  Hgb stable at 12.   PAST MEDICAL HISTORY :   has a past medical history of Hypertension; Arthritis (Jan. 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  has past surgical history that includes Kidney stones; Colon surgery (1997); bypass graft othr,aortobifemoral (April 03, 1998); Carotid endarterectomy (Sept. 1998); and Carotid endarterectomy (May 25, 2008). Prior to Admission medications   Medication Sig Start Date End Date Taking? Authorizing Provider  amLODipine-benazepril (LOTREL) 5-20 MG per capsule Take 1 capsule by mouth daily.   Yes Historical Provider, MD  aspirin 81 MG tablet Take 81 mg by mouth daily.   Yes Historical Provider, MD  Calcium Carbonate-Vitamin D (CALTRATE 600+D PO) Take 1 tablet by mouth daily.   Yes Historical Provider, MD  Cholecalciferol (VITAMIN D PO) Take 1 tablet by mouth daily.   Yes Historical Provider, MD  Ferrous Sulfate (IRON) 325 (65 FE) MG TABS Take 1 tablet by mouth daily.    Yes Historical Provider, MD  furosemide (LASIX) 20 MG tablet Take 40 mg by mouth daily.   Yes Historical Provider, MD  LUMIGAN 0.01 % SOLN Place 1 drop into both eyes at bedtime. 01/05/2014  Yes Historical Provider, MD  metoprolol (LOPRESSOR) 100 MG tablet Take 100 mg by mouth 2 (two) times daily.   Yes Historical Provider, MD  Omega-3 Fatty Acids (FISH OIL PO) Take 1 capsule by mouth daily.   Yes Historical Provider, MD  potassium chloride SA (K-DUR,KLOR-CON) 20 MEQ tablet Take 20 mEq by mouth daily.   Yes Historical Provider, MD  simvastatin (ZOCOR) 20  MG tablet Take 20 mg by mouth daily at 6 PM.   Yes Historical Provider, MD   Allergies  Allergen Reactions  . Codeine Itching and Rash  . Shellfish Allergy Itching and Rash    FAMILY HISTORY:  family history includes Cancer in her maternal grandmother; Heart attack in her father; Leukemia in her brother; Stroke in her  mother. SOCIAL HISTORY:  reports that she quit smoking about 21 years ago. Her smoking use included Cigarettes. She smoked 0.00 packs per day. She has never used smokeless tobacco. She reports that she does not drink alcohol or use illicit drugs.  REVIEW OF SYSTEMS:   All negative; except for those that are bolded, which indicate positives.  Constitutional: weight loss, weight gain, night sweats, fevers, chills, fatigue, weakness.  HEENT: headaches, sore throat, sneezing, nasal congestion, post nasal drip, difficulty swallowing, tooth/dental problems, visual complaints, visual changes, ear aches. Neuro: difficulty with speech, weakness, numbness, ataxia. CV:  chest pain, orthopnea, PND, swelling in lower extremities, dizziness, palpitations, syncope.  Resp: cough, hemoptysis, dyspnea, wheezing. GI  heartburn, indigestion, abdominal pain, nausea, vomiting, diarrhea, constipation, change in bowel habits, loss of appetite, hematemesis, melena, hematochezia.  GU: dysuria, change in color of urine, urgency or frequency, flank pain, hematuria. MSK: joint pain or swelling, decreased range of motion. Psych: change in mood or affect, depression, anxiety, suicidal ideations, homicidal ideations. Skin: rash, itching, bruising.   SUBJECTIVE:   VITAL SIGNS: Temp:  [94.8 F (34.9 C)-98.6 F (37 C)] 98.6 F (37 C) (12/24 0410) Pulse Rate:  [38-64] 48 (12/24 0415) Resp:  [13-28] 16 (12/24 0415) BP: (86-129)/(24-97) 117/34 mmHg (12/24 0415) SpO2:  [88 %-100 %] 93 % (12/24 0415) Weight:  [43.999 kg (97 lb)] 43.999 kg (97 lb) (12/24 0016)  PHYSICAL EXAMINATION: General: Elderly female, resting in bed, in NAD. Neuro: A&O x 3, non-focal.  HEENT: [place]/AT. PERRL, sclerae anicteric. Cardiovascular: Bradycardic, regular, 2/6 SEM. Lungs: Respirations even and unlabored.  CTA bilaterally, No W/R/R. Abdomen: BS x 4, soft, tenderness in LLQ and LUQ. Musculoskeletal: No gross deformities, no edema.  Skin:  Intact, warm, no rashes.    Recent Labs Lab 12/29/13 0016 12/29/13 0033  NA 140 140  K 3.0* 3.1*  CL 99 101  CO2 25  --   BUN 34* 35*  CREATININE 2.41* 2.30*  GLUCOSE 192* 192*    Recent Labs Lab 12/29/13 0016 12/29/13 0033 12/29/13 0351  HGB 14.0 15.0 12.3  HCT 41.7 44.0 36.8  WBC 16.3*  --  9.0  PLT 169  --  131*   Dg Chest 2 View  12/29/2013   CLINICAL DATA:  Acute onset of shortness of breath and weakness. Initial encounter.  EXAM: CHEST  2 VIEW  COMPARISON:  Chest radiograph from 05/13/2010  FINDINGS: The lungs are hyperexpanded, with flattening of the hemidiaphragms, compatible with COPD. There is mild elevation of the left hemidiaphragm. There is no evidence of focal opacification, pleural effusion or pneumothorax.  The heart is mildly enlarged. No acute osseous abnormalities are seen. External pacing pads are noted. A metallic device is seen overlying the mediastinum. Clips are seen overlying the left side of the neck.  IMPRESSION: No acute cardiopulmonary process seen. Mild elevation of the left hemidiaphragm. Findings of COPD and mild cardiomegaly.   Electronically Signed   By: Roanna Raider M.D.   On: 12/29/2013 01:26   Ct Head Wo Contrast  12/29/2013   CLINICAL DATA:  Vomiting  EXAM: CT HEAD WITHOUT CONTRAST  TECHNIQUE: Contiguous  axial images were obtained from the base of the skull through the vertex without intravenous contrast.  COMPARISON:  04/24/2010  FINDINGS: Mild cortical volume loss noted with proportional ventricular prominence. Areas of periventricular white matter hypodensity are most compatible with small vessel ischemic change. No acute hemorrhage, infarct, or mass lesion is identified. Remote left external capsule infarct reidentified. No midline shift. Minimal ethmoid mucoperiosteal thickening. No skull fracture.  IMPRESSION: Chronic findings as above, no acute intracranial finding.   Electronically Signed   By: Christiana PellantGretchen  Green M.D.   On: 12/29/2013  02:31   Ct Abdomen Pelvis W Contrast  12/29/2013   CLINICAL DATA:  Hematochezia and diarrhea, acute onset. Initial encounter.  EXAM: CT ABDOMEN AND PELVIS WITH CONTRAST  TECHNIQUE: Multidetector CT imaging of the abdomen and pelvis was performed using the standard protocol following bolus administration of intravenous contrast.  CONTRAST:  80 mL of Omnipaque 300 IV contrast  COMPARISON:  Lumbar spine radiograph performed 05/31/2013, and renal ultrasound from 11/03/2008  FINDINGS: Mild nonspecific peribronchial thickening is noted at the lung bases. A small pericardial effusion is noted.  The liver and spleen are unremarkable in appearance. The gallbladder is within normal limits. The pancreas and right adrenal gland are unremarkable. A nonspecific 2.0 cm left adrenal mass is noted.  There is severe chronic left renal atrophy. There is slight right renal atrophy. A massive cyst is noted at the lower pole of the right kidney, measuring approximately 10.9 cm. Chronic severe left-sided hydronephrosis is noted, apparently reflecting a stricture at the proximal left ureter.  No definite renal or ureteral stones are identified.  There is a somewhat unusual pattern of wall enhancement with regard to the visualized small bowel, which may reflect an underlying infectious or inflammatory process. The small bowel is distended up to 3.0 cm, borderline normal in diameter, without definite evidence of obstruction.  The stomach is within normal limits. No acute vascular abnormalities are seen. The patient is status post aortobifemoral bypass graft. The bypass graft appears fully patent. 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 There is slight ectasia of the abdominal aorta proximal to the bypass graft, without evidence of aneurysmal dilatation. Scattered calcific atherosclerotic disease is seen along the proximal abdominal aorta, with mild associated intramural thrombus, somewhat irregular in appearance.   The appendix is normal in caliber and contains trace air, without evidence for appendicitis.  There is suggestion of mild diffuse wall thickening and associated mild soft tissue inflammation along the ascending and transverse colon, with perhaps minimal wall thickening along the distal sigmoid colon. This may reflect a segmental colitis, either infectious or inflammatory in nature. There is no evidence of ischemia. No definite focal source of bleeding is identified on this study.  The bladder is not definitely seen. The uterus contains multiple small calcified fibroids, and is otherwise unremarkable. The ovaries are difficult to fully assess due to surrounding bowel structures. No inguinal lymphadenopathy is seen.  No acute osseous abnormalities are identified. There are healed fractures of the right superior and inferior pubic rami. There is chronic bilateral hip joint space narrowing, more severe on the right, with associated sclerotic change and osteophyte formation on the right side. Nonspecific sclerotic change and expansion are noted at the right sacroiliac joint.  There is minimal grade 1 anterolisthesis of L3 on L4, reflecting underlying facet disease.  IMPRESSION: 1. Suggestion of mild diffuse wall thickening and soft tissue inflammation along the ascending and transverse colon, with perhaps minimal wall thickening along  the distal sigmoid colon. This raises concern for mild segmental colitis, either infectious or inflammatory in nature. This could explain the patient's hematochezia. 2. Somewhat unusual pattern of wall enhancement with regard to the visualized small bowel, which may reflect an underlying infectious or inflammatory process. Small bowel is borderline normal in diameter, without evidence of bowel obstruction. 3. Scattered calcific atherosclerotic disease along the proximal abdominal aorta, with mild irregular appearing intramural thrombus. The aortobifemoral bypass graft remains patent. 4.  Severe chronic left-sided hydronephrosis, apparently reflecting a stricture at the proximal left ureter. Severe left renal atrophy noted. 5. Very large right renal cyst noted. 6. Nonspecific 2.0 cm left adrenal mass noted. Adrenal protocol MRI or CT could be considered for further evaluation, when and as deemed clinically appropriate. 7. Small pericardial effusion noted. 8. Nonspecific mild peribronchial thickening noted at the lung bases. 9. Chronic bilateral hip joint space narrowing, more severe on the right, with associated right-sided sclerotic change and osteophyte formation.   Electronically Signed   By: Roanna Raider M.D.   On: 12/29/2013 02:36    ASSESSMENT / PLAN:  Severe Sepsis - likely from colitis, concern for ischemic bowel especially given hx of prior aortofemoral bypass grafting Recs: Continue vanc / zosyn. Follow cultures. Narrow abx as cultures result. IVF resuscitation.  Bradycardia - note, pt took metoprolol for first time tonight Small pericardial effusion Recs: If worsens, consider low dose dopamine. Echo. Hold outpatient amlodipine-benazepril, furosemide, metoprolol.  Colitis with concern for ischemic bowel especially given hx of prior aortofemoral bypass grafting. Recs: NPO. Consider surgical consult.  Rest per primary team.   Rutherford Guys, PA - C  Pulmonary & Critical Care Medicine Pgr: [phone]  or [phone] 12/29/2013, 5:03 AM   PCCM ATTENDING: Pt seen on work rounds with care provider noted above. We reviewed pt's initial presentation, consultants notes and hospital database in detail.  The above assessment and plan was formulated under my direction.  In summary: Frail 68 F with CAD and cerebrovasc dz Recent significant wt loss Presented to ED with several hours of N/V/D, hematochezia, lactic acidosis, hypotension/shock, AKI CT abd c/w colitis Seen by [facility]CS and deemed poor candidate for surgery She remains hypotensive in part  due to long acting anti-hypertensives (amlodipine, benazapril, metoprolol)  I concur strongly that she is not a candidate for laparotomy/major bowel surgery and with recommendation to focus on palliation. She is a very poor candidate for ACLS/CPR, intubation and even for CVL placement and vasopressors as these measures would probably offer little chance of a recovery to functional state  Rec: SDU admission IVFs Empiric abx Clarify EOL/code status - recommend full DNR Consider Palliative Care consultation PCCM will be available PRN   Billy Fischer, MD;  PCCM service; [place] [phone]

## 2013-12-29 NOTE — ED Notes (Signed)
Family at bedside. 

## 2013-12-29 NOTE — Telephone Encounter (Deleted)
Pharmacy called because pt needed prior auth for Vancomycin prescription. NCM called [facility] to obtain prior auth for medication.      

## 2013-12-30 LAB — BASIC METABOLIC PANEL
Anion gap: 17 — ABNORMAL HIGH (ref 5–15)
BUN: 54 mg/dL — AB (ref 6–23)
CO2: 19 mmol/L (ref 19–32)
Calcium: 8.8 mg/dL (ref 8.4–10.5)
Chloride: 108 mEq/L (ref 96–112)
Creatinine, Ser: 4.1 mg/dL — ABNORMAL HIGH (ref 0.50–1.10)
GFR calc Af Amer: 11 mL/min — ABNORMAL LOW (ref >90)
GFR, EST NON AFRICAN AMERICAN: 9 mL/min — AB (ref >90)
Glucose, Bld: 81 mg/dL (ref 70–99)
POTASSIUM: 4.1 mmol/L (ref 3.5–5.1)
SODIUM: 144 mmol/L (ref 135–145)

## 2013-12-30 LAB — CBC
HEMATOCRIT: 36.1 % (ref 36.0–46.0)
HEMOGLOBIN: 11.8 g/dL — AB (ref 12.0–15.0)
MCH: 30.1 pg (ref 26.0–34.0)
MCHC: 32.7 g/dL (ref 30.0–36.0)
MCV: 92.1 fL (ref 78.0–100.0)
Platelets: 110 10*3/uL — ABNORMAL LOW (ref 150–400)
RBC: 3.92 MIL/uL (ref 3.87–5.11)
RDW: 13.9 % (ref 11.5–15.5)
WBC: 11.7 10*3/uL — AB (ref 4.0–10.5)

## 2013-12-30 LAB — URINE CULTURE
Colony Count: NO GROWTH
Culture: NO GROWTH
SPECIAL REQUESTS: NORMAL

## 2013-12-30 LAB — LACTIC ACID, PLASMA: LACTIC ACID, VENOUS: 4.7 mmol/L — AB (ref 0.5–2.2)

## 2013-12-30 MED ORDER — WHITE PETROLATUM GEL
Status: AC
  Administered 2013-12-30: 15:00:00
  Filled 2013-12-30: qty 5

## 2013-12-30 NOTE — Progress Notes (Signed)
PROGRESS NOTE  [name]:[id number] DOB: 03-18-1927 DOA: 12/18/2013 PCP:  Duane Lope, MD  HPI/Recap of past 24 hours: Patient is an 80 year old female past mental history of hypertension and hyperlipidemia in her usual health when she started having severe diarrhea last night and came into the emergency room with weakness. Patient found to have sepsis from ischemic bowel. Given degree of involvement and advanced age, critical-care and Gen. surgery agreed that she would not be an operative candidate. This was discussed with the family and patient was managed conservatively with IV fluids and antibiotics after being admitted to the hospitalist service on the morning of 12/24.  Following admission, discussed with family and patient was made DO NOT RESUSCITATE. Patient today is unresponsive. After discussion with patient's family and informed them of worsening lab results, they have decided for full comfort care  Assessment/Plan: Principal Problem:   Sepsis with secondary renal failure from ischemic bowel disease secondary to hypotension caused by diarrhea: Initially Managing conservatively with IV fluids and antibiotics.  Labs today noted normal white count, however with renal function worsening as well as lactic acid level increasing, clearly evidence of continued worsening sepsis and ischemic bowel. After informing family, they would like to go with full comfort care. Antibiotics and other medications discontinued except for medicines for pain, nausea and agitation only. Suspect patient will likely pass away in the next 24-36 hours   Bradycardia   Hypercalcemia: Secondary to dehydration     Essential hypertension: Antihypertensives on hold secondary to hypotension   Hyperlipidemia: Statin on hold   Code Status: DO NOT RESUSCITATE  Family Communication: Plan discussed with patient's sons  Disposition Plan: Patient will likely pass away in the next 1-2 days   Consultants:  General  surgery  Critical care  Procedures:  None  Antibiotics:  IV Zosyn and 12/24-12/25  IV vancomycin 12/24-12/25   Objective: BP 96/70 mmHg  Pulse 68  Temp(Src) 97.5 F (36.4 C) (Axillary)  Resp 20  Ht 5' (1.524 m)  Wt 43.999 kg (97 lb)  BMI 18.94 kg/m2  SpO2 96%  Intake/Output Summary (Last 24 hours) at 12/30/13 1542 Last data filed at 12/30/13 1500  Gross per 24 hour  Intake 1511.67 ml  Output      0 ml  Net 1511.67 ml   Filed Weights   12/29/13 0016  Weight: 43.999 kg (97 lb)    Exam:   General:  Unresponsive, appears comfortable  Cardiovascular: Regular rate and rhythm  Respiratory: Clear to auscultation bilaterally  Abdomen: Soft, no appreciable bowel sounds  Musculoskeletal: Trace edema   Data Reviewed: Basic Metabolic Panel:  Recent Labs Lab 12/29/13 0016 12/29/13 0033 12/30/13 0411  NA 140 140 144  K 3.0* 3.1* 4.1  CL 99 101 108  CO2 25  --  19  GLUCOSE 192* 192* 81  BUN 34* 35* 54*  CREATININE 2.41* 2.30* 4.10*  CALCIUM 12.1*  --  8.8   Liver Function Tests:  Recent Labs Lab 12/29/13 0016  AST 54*  ALT 25  ALKPHOS 156*  BILITOT 0.9  PROT 7.0  ALBUMIN 3.7    Recent Labs Lab 12/29/13 0016  LIPASE 73*   No results for input(s): AMMONIA in the last 168 hours. CBC:  Recent Labs Lab 12/29/13 0016 12/29/13 0033 12/29/13 0351 12/30/13 0411  WBC 16.3*  --  9.0 11.7*  NEUTROABS 13.3*  --  8.1*  --   HGB 14.0 15.0 12.3 11.8*  HCT 41.7 44.0 36.8 36.1  MCV 89.3  --  90.4 92.1  PLT 169  --  131* 110*   Cardiac Enzymes:    Recent Labs Lab 12/29/13 0402  TROPONINI 0.07*   BNP (last 3 results) No results for input(s): PROBNP in the last 8760 hours. CBG: No results for input(s): GLUCAP in the last 168 hours.  Recent Results (from the past 240 hour(s))  Culture, blood (routine x 2)     Status: None (Preliminary result)   Collection Time: 12/29/13 12:26 AM  Result Value Ref Range Status   Specimen Description BLOOD  LEFT ARM  Final   Special Requests BOTTLES DRAWN AEROBIC ONLY 8CC  Final   Culture   Final           BLOOD CULTURE RECEIVED NO GROWTH TO DATE CULTURE WILL BE HELD FOR 5 DAYS BEFORE ISSUING A FINAL NEGATIVE REPORT Performed at [facility]    Report Status PENDING  Incomplete  Culture, blood (routine x 2)     Status: None (Preliminary result)   Collection Time: 12/29/13 12:26 AM  Result Value Ref Range Status   Specimen Description BLOOD RIGHT FOREARM  Final   Special Requests BOTTLES DRAWN AEROBIC ONLY 3CC  Final   Culture   Final           BLOOD CULTURE RECEIVED NO GROWTH TO DATE CULTURE WILL BE HELD FOR 5 DAYS BEFORE ISSUING A FINAL NEGATIVE REPORT Performed at [facility]    Report Status PENDING  Incomplete  Urine culture     Status: None   Collection Time: 12/29/13  1:51 AM  Result Value Ref Range Status   Specimen Description URINE, CATHETERIZED  Final   Special Requests Normal  Final   Colony Count NO GROWTH Performed at [facility]   Final   Culture NO GROWTH Performed at [facility]   Final   Report Status 12/30/2013 FINAL  Final     Studies: Dg Chest 2 View  12/29/2013   CLINICAL DATA:  Acute onset of shortness of breath and weakness. Initial encounter.  EXAM: CHEST  2 VIEW  COMPARISON:  Chest radiograph from 05/13/2010  FINDINGS: The lungs are hyperexpanded, with flattening of the hemidiaphragms, compatible with COPD. There is mild elevation of the left hemidiaphragm. There is no evidence of focal opacification, pleural effusion or pneumothorax.  The heart is mildly enlarged. No acute osseous abnormalities are seen. External pacing pads are noted. A metallic device is seen overlying the mediastinum. Clips are seen overlying the left side of the neck.  IMPRESSION: No acute cardiopulmonary process seen. Mild elevation of the left hemidiaphragm. Findings of COPD and mild cardiomegaly.   Electronically Signed   By: Roanna RaiderJeffery  Chang M.D.   On:  12/29/2013 01:26   Ct Head Wo Contrast  12/29/2013   CLINICAL DATA:  Vomiting  EXAM: CT HEAD WITHOUT CONTRAST  TECHNIQUE: Contiguous axial images were obtained from the base of the skull through the vertex without intravenous contrast.  COMPARISON:  04/24/2010  FINDINGS: Mild cortical volume loss noted with proportional ventricular prominence. Areas of periventricular white matter hypodensity are most compatible with small vessel ischemic change. No acute hemorrhage, infarct, or mass lesion is identified. Remote left external capsule infarct reidentified. No midline shift. Minimal ethmoid mucoperiosteal thickening. No skull fracture.  IMPRESSION: Chronic findings as above, no acute intracranial finding.   Electronically Signed   By: Christiana PellantGretchen  Green M.D.   On: 12/29/2013 02:31   Ct Abdomen Pelvis W Contrast  12/29/2013   CLINICAL DATA:  Hematochezia and diarrhea, acute onset. Initial encounter.  EXAM: CT ABDOMEN AND PELVIS WITH CONTRAST  TECHNIQUE: Multidetector CT imaging of the abdomen and pelvis was performed using the standard protocol following bolus administration of intravenous contrast.  CONTRAST:  80 mL of Omnipaque 300 IV contrast  COMPARISON:  Lumbar spine radiograph performed 05/31/2013, and renal ultrasound from 11/03/2008  FINDINGS: Mild nonspecific peribronchial thickening is noted at the lung bases. A small pericardial effusion is noted.  The liver and spleen are unremarkable in appearance. The gallbladder is within normal limits. The pancreas and right adrenal gland are unremarkable. A nonspecific 2.0 cm left adrenal mass is noted.  There is severe chronic left renal atrophy. There is slight right renal atrophy. A massive cyst is noted at the lower pole of the right kidney, measuring approximately 10.9 cm. Chronic severe left-sided hydronephrosis is noted, apparently reflecting a stricture at the proximal left ureter.  No definite renal or ureteral stones are identified.  There is a somewhat  unusual pattern of wall enhancement with regard to the visualized small bowel, which may reflect an underlying infectious or inflammatory process. The small bowel is distended up to 3.0 cm, borderline normal in diameter, without definite evidence of obstruction.  The stomach is within normal limits. No acute vascular abnormalities are seen. The patient is status post aortobifemoral bypass graft. The bypass graft appears fully patent. 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 There is slight ectasia of the abdominal aorta proximal to the bypass graft, without evidence of aneurysmal dilatation. Scattered calcific atherosclerotic disease is seen along the proximal abdominal aorta, with mild associated intramural thrombus, somewhat irregular in appearance.  The appendix is normal in caliber and contains trace air, without evidence for appendicitis.  There is suggestion of mild diffuse wall thickening and associated mild soft tissue inflammation along the ascending and transverse colon, with perhaps minimal wall thickening along the distal sigmoid colon. This may reflect a segmental colitis, either infectious or inflammatory in nature. There is no evidence of ischemia. No definite focal source of bleeding is identified on this study.  The bladder is not definitely seen. The uterus contains multiple small calcified fibroids, and is otherwise unremarkable. The ovaries are difficult to fully assess due to surrounding bowel structures. No inguinal lymphadenopathy is seen.  No acute osseous abnormalities are identified. There are healed fractures of the right superior and inferior pubic rami. There is chronic bilateral hip joint space narrowing, more severe on the right, with associated sclerotic change and osteophyte formation on the right side. Nonspecific sclerotic change and expansion are noted at the right sacroiliac joint.  There is minimal grade 1 anterolisthesis of L3 on L4, reflecting  underlying facet disease.  IMPRESSION: 1. Suggestion of mild diffuse wall thickening and soft tissue inflammation along the ascending and transverse colon, with perhaps minimal wall thickening along the distal sigmoid colon. This raises concern for mild segmental colitis, either infectious or inflammatory in nature. This could explain the patient's hematochezia. 2. Somewhat unusual pattern of wall enhancement with regard to the visualized small bowel, which may reflect an underlying infectious or inflammatory process. Small bowel is borderline normal in diameter, without evidence of bowel obstruction. 3. Scattered calcific atherosclerotic disease along the proximal abdominal aorta, with mild irregular appearing intramural thrombus. The aortobifemoral bypass graft remains patent. 4. Severe chronic left-sided hydronephrosis, apparently reflecting a stricture at the proximal left ureter. Severe left renal atrophy noted. 5. Very large right renal  cyst noted. 6. Nonspecific 2.0 cm left adrenal mass noted. Adrenal protocol MRI or CT could be considered for further evaluation, when and as deemed clinically appropriate. 7. Small pericardial effusion noted. 8. Nonspecific mild peribronchial thickening noted at the lung bases. 9. Chronic bilateral hip joint space narrowing, more severe on the right, with associated right-sided sclerotic change and osteophyte formation.   Electronically Signed   By: Roanna Raider M.D.   On: 12/29/2013 02:36    Scheduled Meds: . white petrolatum        Continuous Infusions:     Time spent: 15 minutes  Hollice Espy  [facility] Pager [phone]. If 7PM-7AM, please contact night-coverage at [email or web address], password [facility] 12/30/2013, 3:42 PM  LOS: 2 days

## 2013-12-30 NOTE — Progress Notes (Signed)
Subjective: Feeble. Verbalizes a little but not oriented at all.  Review of notes from yesterday, and particularly Dr. Mellody Drown lines noted in the afternoon, patient is now DO NOT RESUSCITATE and is being treated medically. Lactic acid 4.7. WBC 11,700. Creatinine 4.1. BUN 54. Potassium 4.1.  Objective: Vital signs in last 24 hours: Temp:  [97.7 F (36.5 C)-98.7 F (37.1 C)] 97.7 F (36.5 C) (12/25 0554) Pulse Rate:  [51-119] 62 (12/25 0554) Resp:  [20-35] 20 (12/25 0554) BP: (67-96)/(25-79) 84/64 mmHg (12/25 0554) SpO2:  [90 %-100 %] 100 % (12/25 0554) Last BM Date: 12/29/13  Intake/Output from previous day: 12/24 0701 - 12/25 0700 In: 1511.7 [I.V.:1361.7; IV Piggyback:150] Out: -  Intake/Output this shift:    General appearance: Elderly. Pale. Skin cool but dry. Arouses but does not interact. Moderate distress. GI: Abdomen soft but diffusely tender with diffuse guarding. Bowel sounds absent.  Lab Results:   Recent Labs  12/29/13 0351 12/30/13 0411  WBC 9.0 11.7*  HGB 12.3 11.8*  HCT 36.8 36.1  PLT 131* 110*   BMET  Recent Labs  12/29/13 0016 12/29/13 0033 12/30/13 0411  NA 140 140 144  K 3.0* 3.1* 4.1  CL 99 101 108  CO2 25  --  19  GLUCOSE 192* 192* 81  BUN 34* 35* 54*  CREATININE 2.41* 2.30* 4.10*  CALCIUM 12.1*  --  8.8   PT/INR  Recent Labs  12/29/13 0016  LABPROT 18.2*  INR 1.49   ABG No results for input(s): PHART, HCO3 in the last 72 hours.  Invalid input(s): PCO2, PO2  Studies/Results: Dg Chest 2 View  12/29/2013   CLINICAL DATA:  Acute onset of shortness of breath and weakness. Initial encounter.  EXAM: CHEST  2 VIEW  COMPARISON:  Chest radiograph from 05/13/2010  FINDINGS: The lungs are hyperexpanded, with flattening of the hemidiaphragms, compatible with COPD. There is mild elevation of the left hemidiaphragm. There is no evidence of focal opacification, pleural effusion or pneumothorax.  The heart is mildly enlarged. No acute  osseous abnormalities are seen. External pacing pads are noted. A metallic device is seen overlying the mediastinum. Clips are seen overlying the left side of the neck.  IMPRESSION: No acute cardiopulmonary process seen. Mild elevation of the left hemidiaphragm. Findings of COPD and mild cardiomegaly.   Electronically Signed   By: Roanna Raider M.D.   On: 12/29/2013 01:26   Ct Head Wo Contrast  12/29/2013   CLINICAL DATA:  Vomiting  EXAM: CT HEAD WITHOUT CONTRAST  TECHNIQUE: Contiguous axial images were obtained from the base of the skull through the vertex without intravenous contrast.  COMPARISON:  04/24/2010  FINDINGS: Mild cortical volume loss noted with proportional ventricular prominence. Areas of periventricular white matter hypodensity are most compatible with small vessel ischemic change. No acute hemorrhage, infarct, or mass lesion is identified. Remote left external capsule infarct reidentified. No midline shift. Minimal ethmoid mucoperiosteal thickening. No skull fracture.  IMPRESSION: Chronic findings as above, no acute intracranial finding.   Electronically Signed   By: Christiana Pellant M.D.   On: 12/29/2013 02:31   Ct Abdomen Pelvis W Contrast  12/29/2013   CLINICAL DATA:  Hematochezia and diarrhea, acute onset. Initial encounter.  EXAM: CT ABDOMEN AND PELVIS WITH CONTRAST  TECHNIQUE: Multidetector CT imaging of the abdomen and pelvis was performed using the standard protocol following bolus administration of intravenous contrast.  CONTRAST:  80 mL of Omnipaque 300 IV contrast  COMPARISON:  Lumbar spine radiograph performed 05/31/2013,  and renal ultrasound from 11/03/2008  FINDINGS: Mild nonspecific peribronchial thickening is noted at the lung bases. A small pericardial effusion is noted.  The liver and spleen are unremarkable in appearance. The gallbladder is within normal limits. The pancreas and right adrenal gland are unremarkable. A nonspecific 2.0 cm left adrenal mass is noted.  There  is severe chronic left renal atrophy. There is slight right renal atrophy. A massive cyst is noted at the lower pole of the right kidney, measuring approximately 10.9 cm. Chronic severe left-sided hydronephrosis is noted, apparently reflecting a stricture at the proximal left ureter.  No definite renal or ureteral stones are identified.  There is a somewhat unusual pattern of wall enhancement with regard to the visualized small bowel, which may reflect an underlying infectious or inflammatory process. The small bowel is distended up to 3.0 cm, borderline normal in diameter, without definite evidence of obstruction.  The stomach is within normal limits. No acute vascular abnormalities are seen. The patient is status post aortobifemoral bypass graft. The bypass graft appears fully patent. 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 There is slight ectasia of the abdominal aorta proximal to the bypass graft, without evidence of aneurysmal dilatation. Scattered calcific atherosclerotic disease is seen along the proximal abdominal aorta, with mild associated intramural thrombus, somewhat irregular in appearance.  The appendix is normal in caliber and contains trace air, without evidence for appendicitis.  There is suggestion of mild diffuse wall thickening and associated mild soft tissue inflammation along the ascending and transverse colon, with perhaps minimal wall thickening along the distal sigmoid colon. This may reflect a segmental colitis, either infectious or inflammatory in nature. There is no evidence of ischemia. No definite focal source of bleeding is identified on this study.  The bladder is not definitely seen. The uterus contains multiple small calcified fibroids, and is otherwise unremarkable. The ovaries are difficult to fully assess due to surrounding bowel structures. No inguinal lymphadenopathy is seen.  No acute osseous abnormalities are identified. There are healed fractures of  the right superior and inferior pubic rami. There is chronic bilateral hip joint space narrowing, more severe on the right, with associated sclerotic change and osteophyte formation on the right side. Nonspecific sclerotic change and expansion are noted at the right sacroiliac joint.  There is minimal grade 1 anterolisthesis of L3 on L4, reflecting underlying facet disease.  IMPRESSION: 1. Suggestion of mild diffuse wall thickening and soft tissue inflammation along the ascending and transverse colon, with perhaps minimal wall thickening along the distal sigmoid colon. This raises concern for mild segmental colitis, either infectious or inflammatory in nature. This could explain the patient's hematochezia. 2. Somewhat unusual pattern of wall enhancement with regard to the visualized small bowel, which may reflect an underlying infectious or inflammatory process. Small bowel is borderline normal in diameter, without evidence of bowel obstruction. 3. Scattered calcific atherosclerotic disease along the proximal abdominal aorta, with mild irregular appearing intramural thrombus. The aortobifemoral bypass graft remains patent. 4. Severe chronic left-sided hydronephrosis, apparently reflecting a stricture at the proximal left ureter. Severe left renal atrophy noted. 5. Very large right renal cyst noted. 6. Nonspecific 2.0 cm left adrenal mass noted. Adrenal protocol MRI or CT could be considered for further evaluation, when and as deemed clinically appropriate. 7. Small pericardial effusion noted. 8. Nonspecific mild peribronchial thickening noted at the lung bases. 9. Chronic bilateral hip joint space narrowing, more severe on the right, with associated right-sided sclerotic change and osteophyte  formation.   Electronically Signed   By: Roanna RaiderJeffery  Chang M.D.   On: 12/29/2013 02:36    Anti-infectives: Anti-infectives    Start     Dose/Rate Route Frequency Ordered Stop   05/17/2013 0600  vancomycin (VANCOCIN) 500 mg in  sodium chloride 0.9 % 100 mL IVPB     500 mg100 mL/hr over 60 Minutes Intravenous Every 48 hours 12/29/13 1307     12/29/13 1330  piperacillin-tazobactam (ZOSYN) IVPB 2.25 g     2.25 g100 mL/hr over 30 Minutes Intravenous Every 6 hours 12/29/13 1307     12/29/13 0230  vancomycin (VANCOCIN) IVPB 1000 mg/200 mL premix     1,000 mg200 mL/hr over 60 Minutes Intravenous  Once 12/29/13 0223 12/29/13 0414   12/29/13 0230  piperacillin-tazobactam (ZOSYN) IVPB 3.375 g     3.375 g100 mL/hr over 30 Minutes Intravenous  Once 12/29/13 0223 12/29/13 0313      Assessment/Plan:   Suspect extensive ischemic, and possibly in follow infarcted segments of small bowel and colon. I doubt that she would survive with or without surgery.   I agree with nonoperative management and probably the best thing for her would be palliative, comfort care.  We will sign off at this point. Please reconsult if necessary.   LOS: 2 days    Mubashir Mallek M 12/30/2013

## 2014-01-04 LAB — CULTURE, BLOOD (ROUTINE X 2)
CULTURE: NO GROWTH
Culture: NO GROWTH

## 2014-01-06 NOTE — Progress Notes (Signed)
Patient is a DNR, unresponsive, no noted pulse, no respirations, pooling of blood in feet and hands, next of kin was at bedside at time of death, physician was notified, [facility] was also notified, family took all belongings at bedside.  Patients wedding ring was left by family and desired to have it buried with her, taped wedding ring on finger to keep it in place.

## 2014-01-06 NOTE — Discharge Summary (Signed)
Death Summary  [name] POL:[id number] DOB: [birth year] DOA: 12/17/2013  PCP:  Melinda Crutch, MD PCP/Office notified: Spoke with Dr.Anodi, on-call for Dr. Harrington Challenger  Admit date: 12/07/2013 Date of Death: January 03, 2014  Final Diagnoses:  Principal Problem:   Sepsis Active Problems:   Acute renal failure   Bradycardia   Hypercalcemia   Colitis   Ischemic bowel disease   Essential hypertension   Hyperlipidemia   Blood poisoning   History of present illness:  Patient is an 81 year old female past mental history of hypertension and hyperlipidemia in her usual health when she started having severe diarrhea last night and came into the emergency room with weakness. Patient found to have sepsis from ischemic bowel. Given degree of involvement and advanced age, critical-care and Gen. surgery agreed that she would not be an operative candidate. This was discussed with the family and patient was managed conservatively with IV fluids and antibiotics after being admitted to the hospitalist service on the morning of 12/24.  Hospital Course:  Following admission, discussion was made with family and patient was made DO NOT RESUSCITATE. Antibiotics were continued and by the following day, white count was improved, however lactic acid level had significantly worsened and renal function continued to decline.  Patient at this point unresponsive Family in full understanding of process going on for patient did not want her to suffer. Antibiotics discontinued and she was made full comfort care.  Patient passed away on the morning of 01-04-2023. Family was present and I met with them. They did not have any questions and were grateful for her care. Time: Approximately 9:32 this morning 2023-01-04  Signed:  Gevena Barre K  [facility] 01/03/14, 12:43 PM

## 2014-01-06 DEATH — deceased

## 2014-02-01 ENCOUNTER — Ambulatory Visit: Payer: Medicare Other | Admitting: Family

## 2014-02-01 ENCOUNTER — Other Ambulatory Visit (HOSPITAL_COMMUNITY): Payer: Medicare Other

## 2016-02-14 IMAGING — CR DG PELVIS 1-2V
1 series · 1 of 1 positions shown · non-contrast
Comparison: KUB of 09/29/2012

CLINICAL DATA: Fell 1 week ago with right posterior pelvic pain

EXAM:
PELVIS - 1-2 VIEW

[t pelvis a.p.]
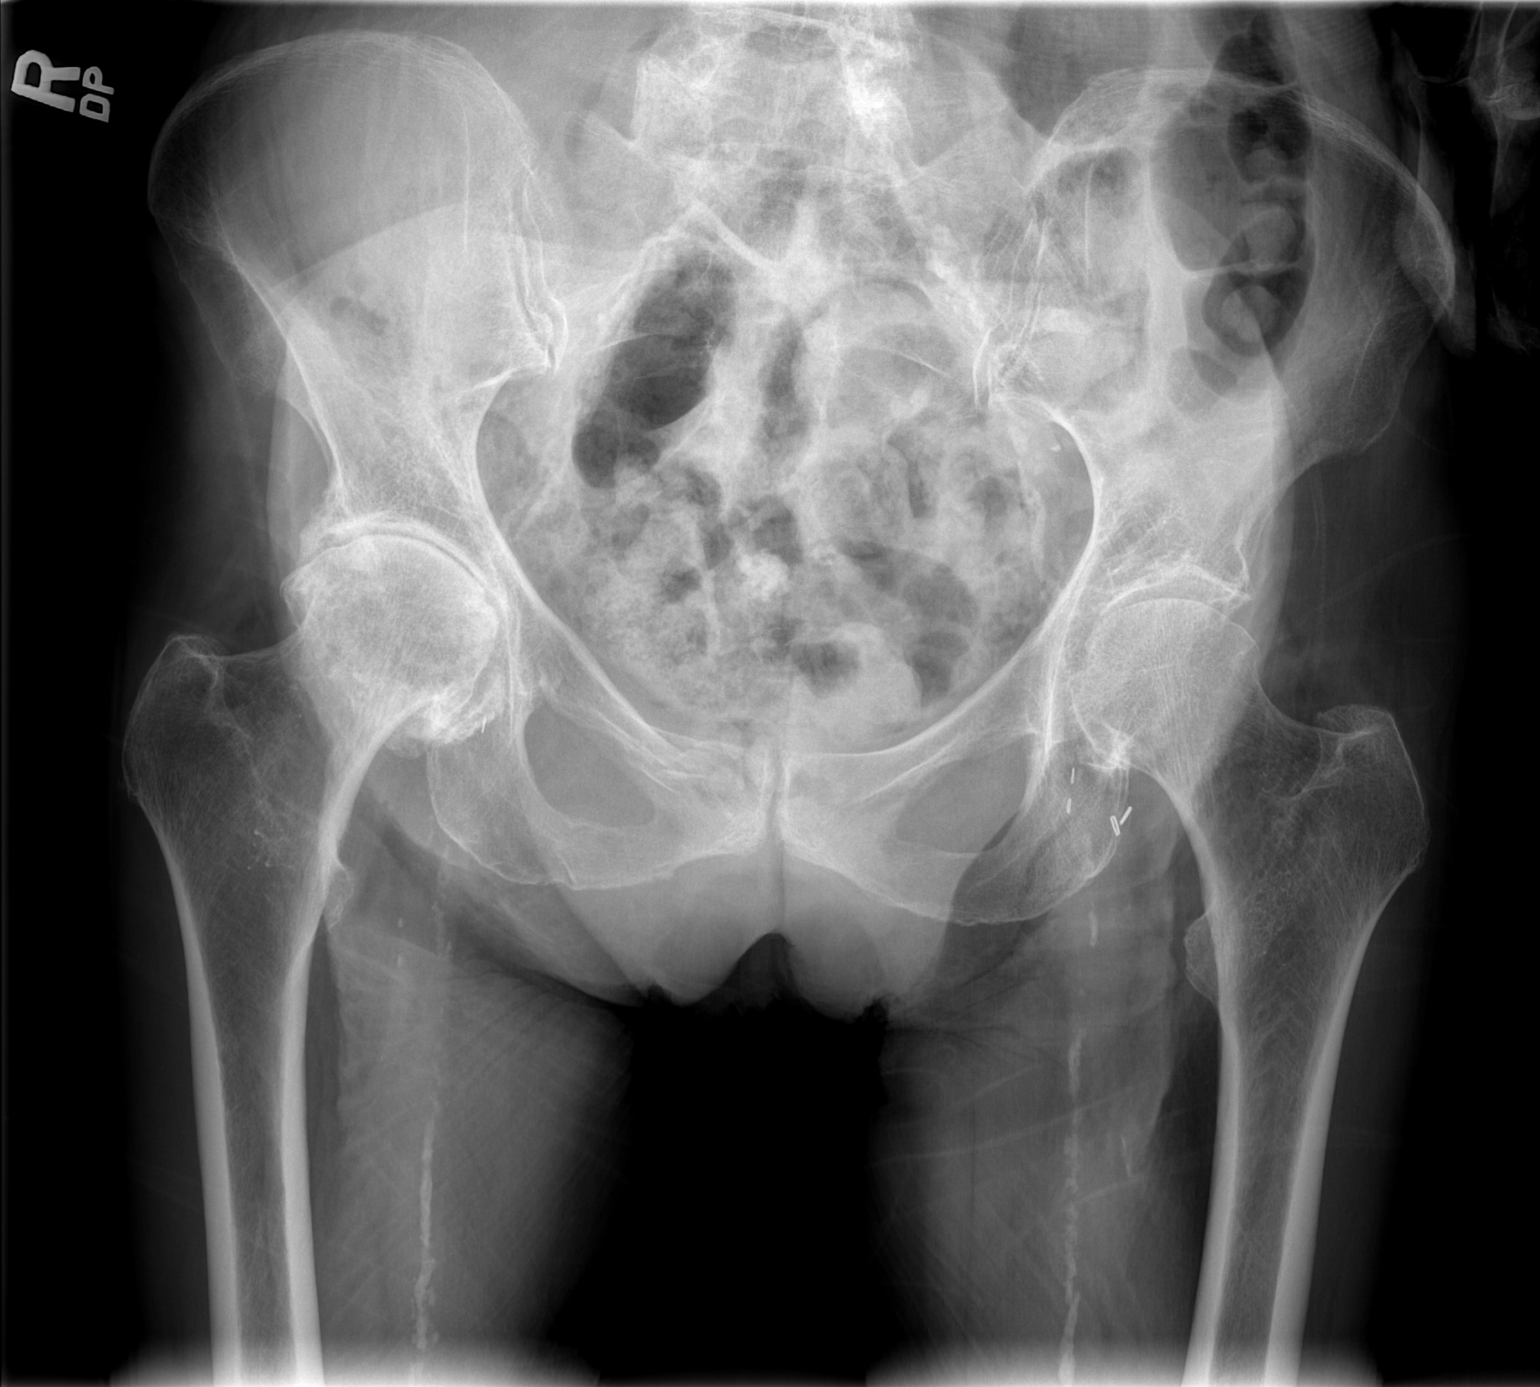

[1 of 1 positions shown; findings below may reference images not displayed]

FINDINGS: There is moderately severe degenerative joint disease of the right
hip with significant loss of joint space, sclerosis, and spurring.
However no acute fracture of the right hip is seen.

However, there are acute fractures of the right superior and
inferior pelvic ramus. CT through the pelvis may be helpful to
assess for any sacral fractures as well. The left hip is relatively
normal for age. The bones are osteopenic.
IMPRESSION: 1. Acute fractures of the superior and inferior right pelvic ramus.
Consider CT of the pelvis to assess further if warranted clinically.
2. Significant degenerative joint disease of the right hip.
Osteopenia.

## 2016-10-18 IMAGING — CT CT HEAD W/O CM
1 series · 16 of 30 positions shown, 20 images · non-contrast
Comparison: 04/24/2010

CLINICAL DATA: Vomiting

EXAM:
CT HEAD WITHOUT CONTRAST
TECHNIQUE: Contiguous axial images were obtained from the base of the skull
through the vertex without intravenous contrast.

[Series 2: head 5.0 h31s · axial · 0.41mm/px · z∈[-139,+1]mm · 16 of 32 slices shown, 20 images]
[im 2/32  brain]
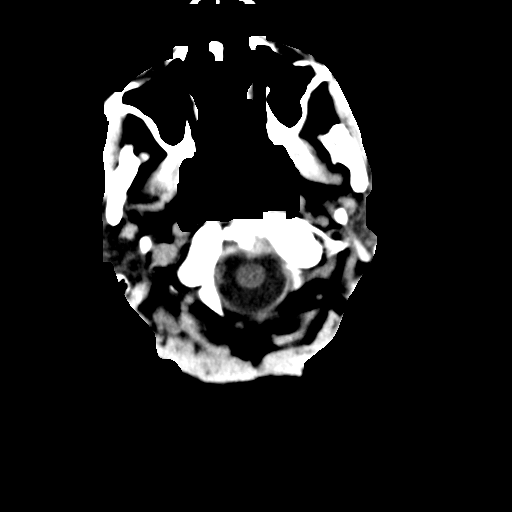
[im 2/32  bone]
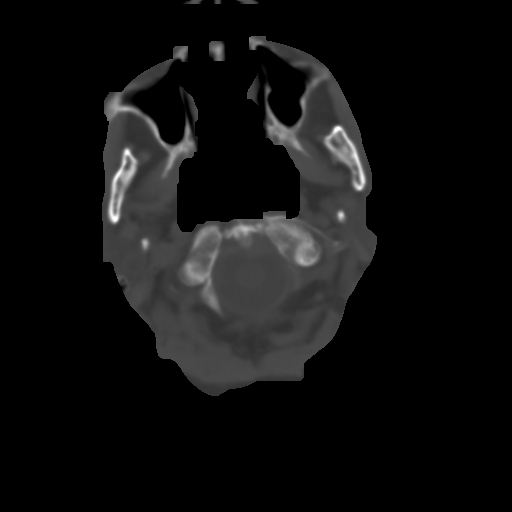
[im 4/32  brain]
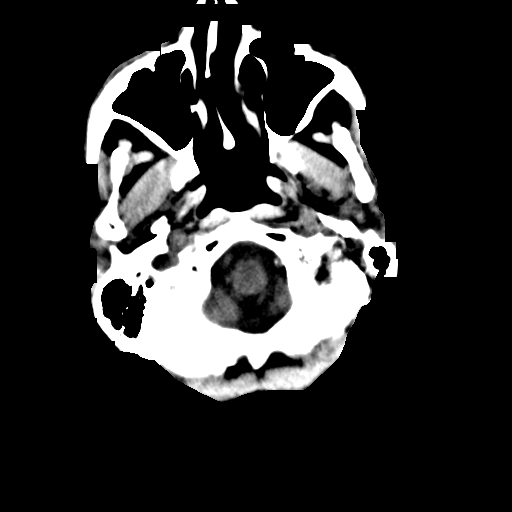
[im 6/32  brain]
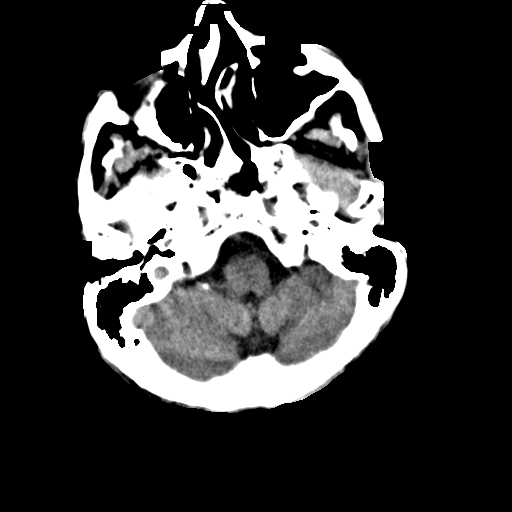
[im 8/32  brain]
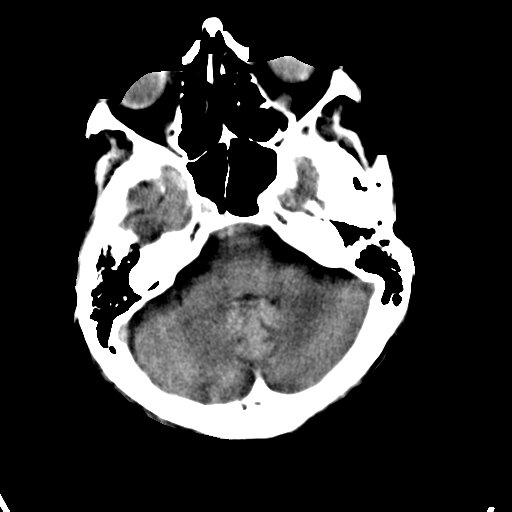
[im 9/32  brain]
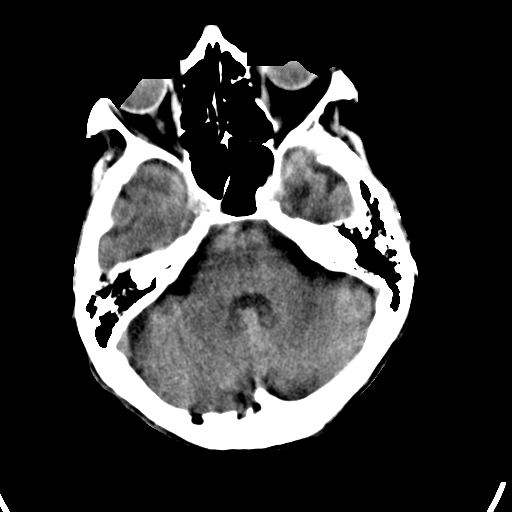
[im 9/32  bone]
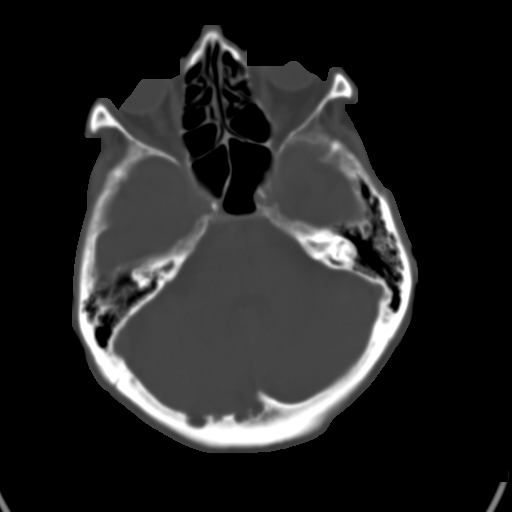
[im 11/32  brain]
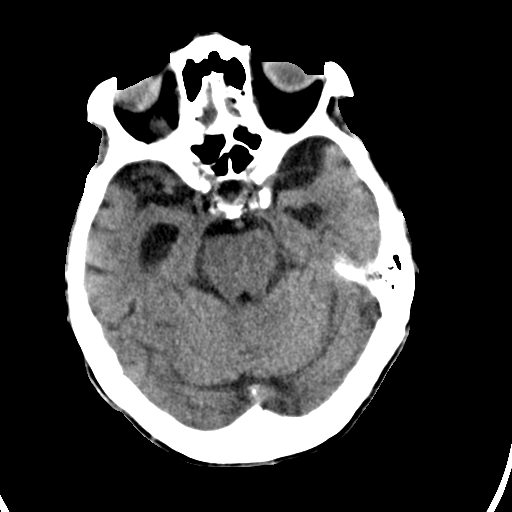
[im 13/32  brain]
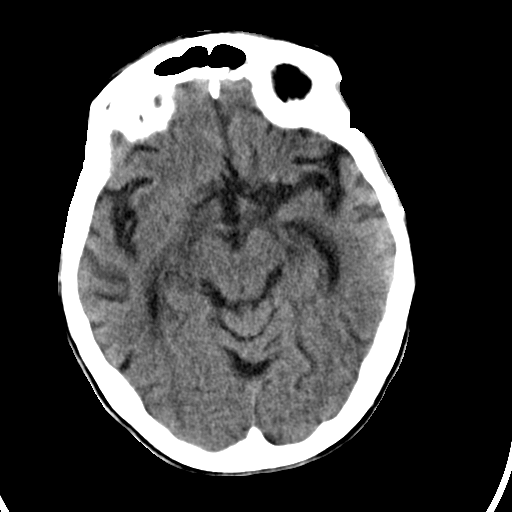
[im 15/32  brain]
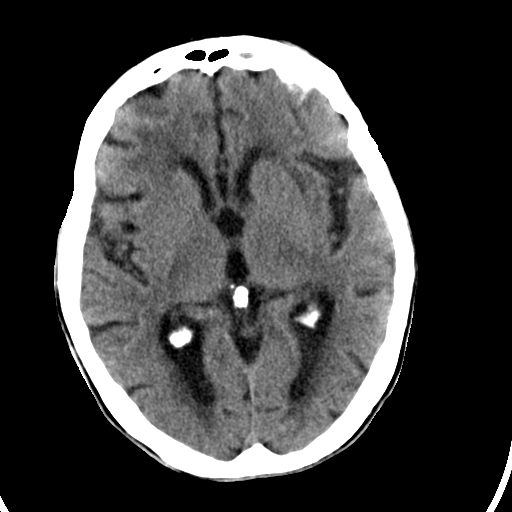
[im 17/32  brain]
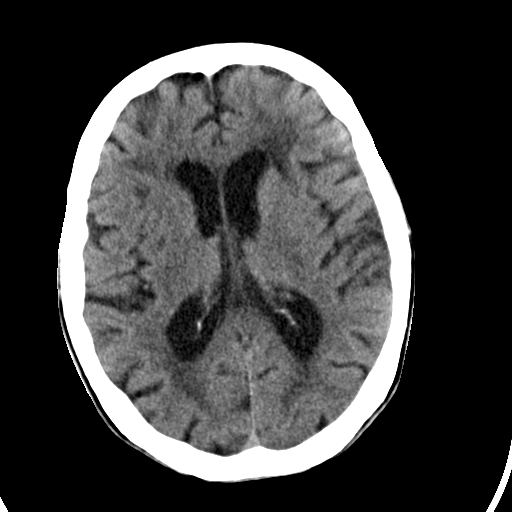
[im 17/32  bone]
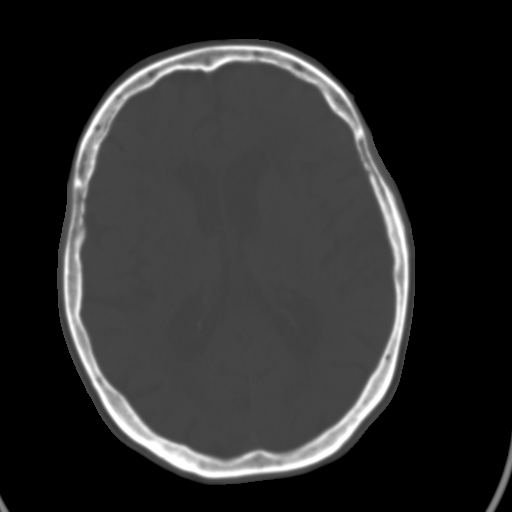
[im 19/32  brain]
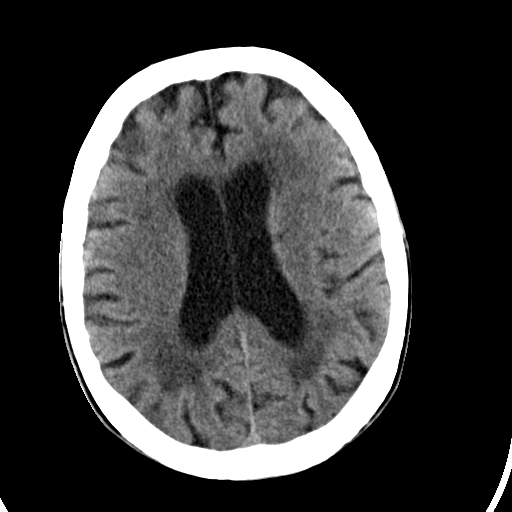
[im 21/32  brain]
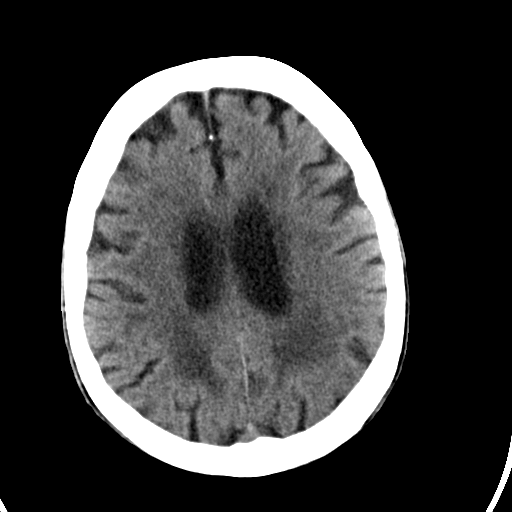
[im 23/32  brain]
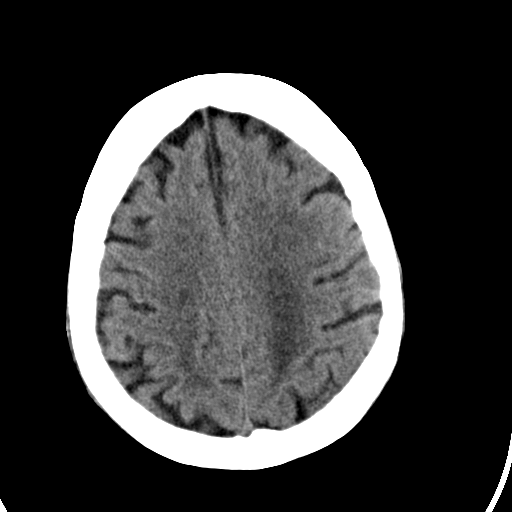
[im 24/32  brain]
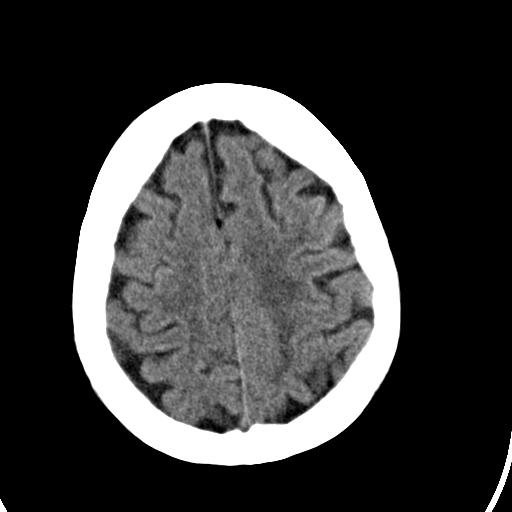
[im 24/32  bone]
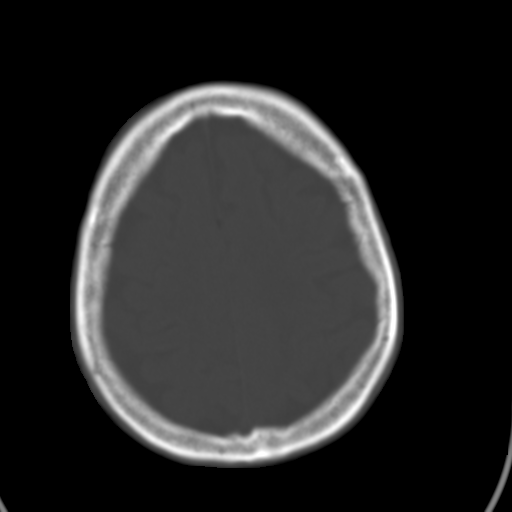
[im 26/32  brain]
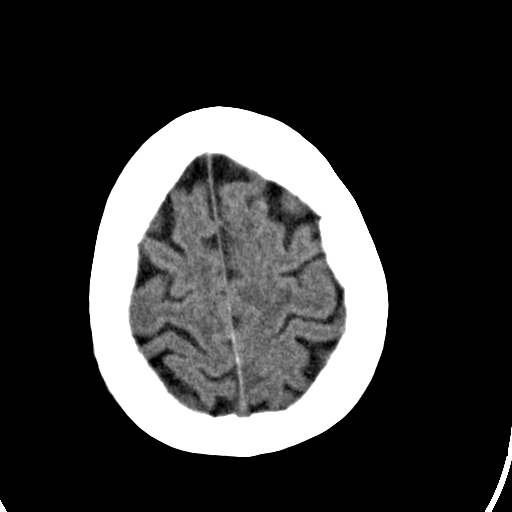
[im 28/32  brain]
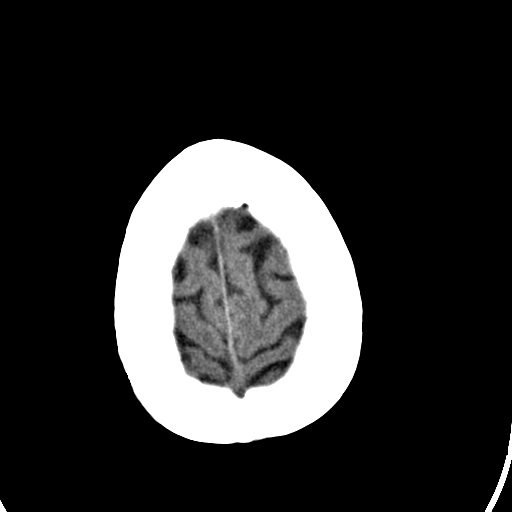
[im 30/32  brain]
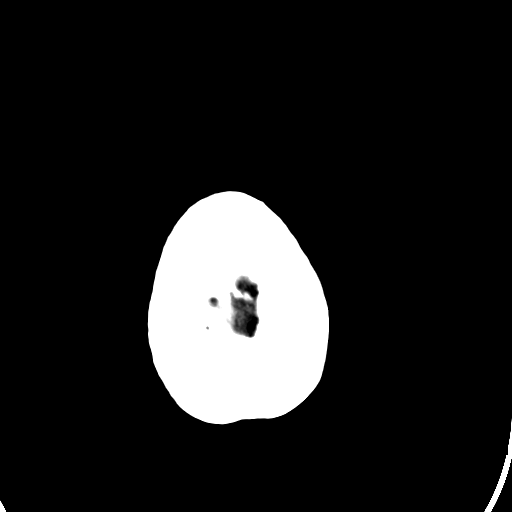

[16 of 30 positions shown; findings below may reference images not displayed]

FINDINGS: Mild cortical volume loss noted with proportional ventricular
prominence. Areas of periventricular white matter hypodensity are
most compatible with small vessel ischemic change. No acute
hemorrhage, infarct, or mass lesion is identified. Remote left
external capsule infarct reidentified. No midline shift. Minimal
ethmoid mucoperiosteal thickening. No skull fracture.
IMPRESSION: Chronic findings as above, no acute intracranial finding.

## 2016-10-18 IMAGING — CT CT ABD-PELV W/ CM
2 of 5 series · 14 of 46 positions shown, 16 images · IV contrast (Omni 300)
Comparison: Lumbar spine radiograph performed 05/31/2013, and renal
ultrasound from 11/03/2008

CLINICAL DATA: Hematochezia and diarrhea, acute onset. Initial
encounter.

EXAM:
CT ABDOMEN AND PELVIS WITH CONTRAST
TECHNIQUE: Multidetector CT imaging of the abdomen and pelvis was performed
using the standard protocol following bolus administration of
intravenous contrast.
CONTRAST:  80 mL of Omnipaque 300 IV contrast

[Series 2: abd/ pelvis 5.0 i30f 1 · axial · 0.65mm/px · z∈[-782,-452]mm · 11 of 76 slices shown, 13 images]
[im 5/76  soft-tissue]
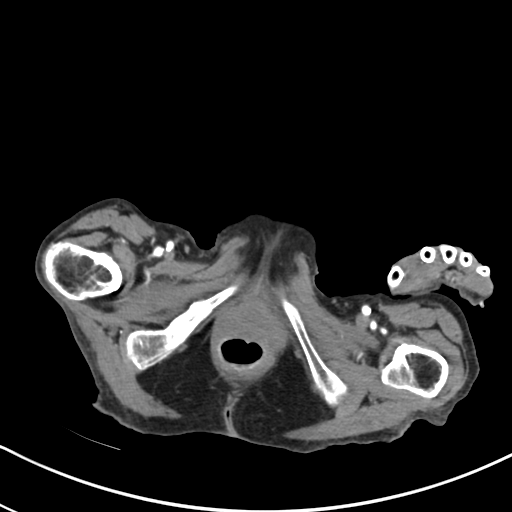
[im 5/76  bone]
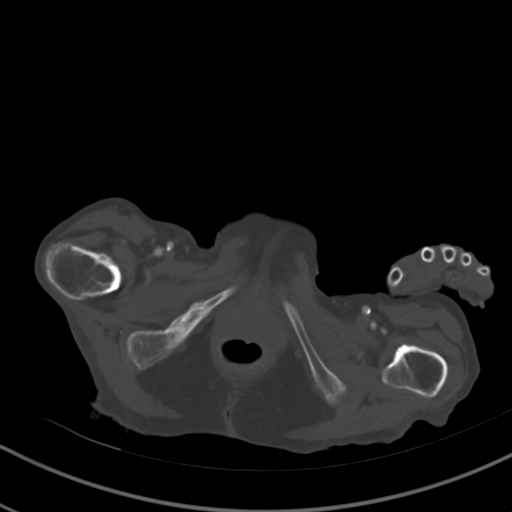
[im 15/76  soft-tissue]
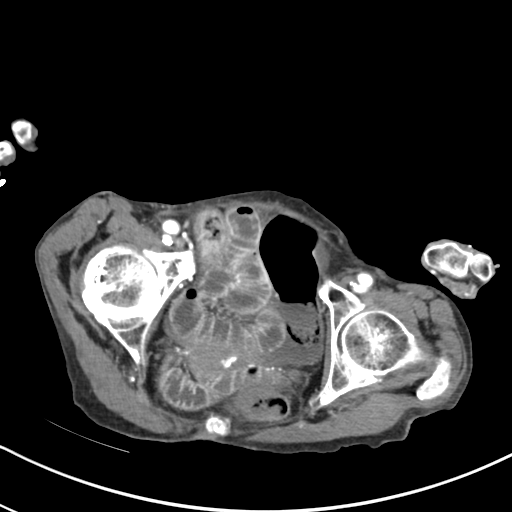
[im 19/76  soft-tissue]
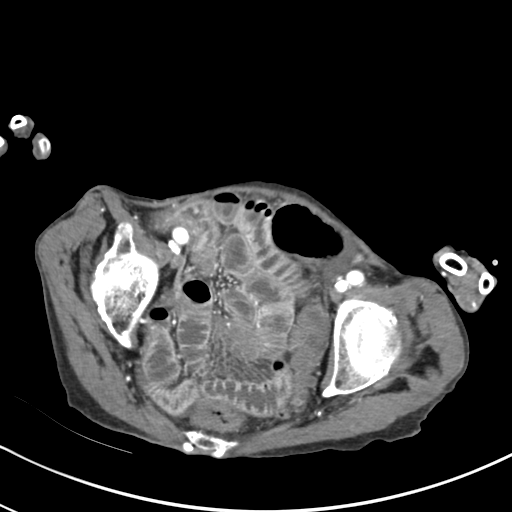
[im 24/76  soft-tissue]
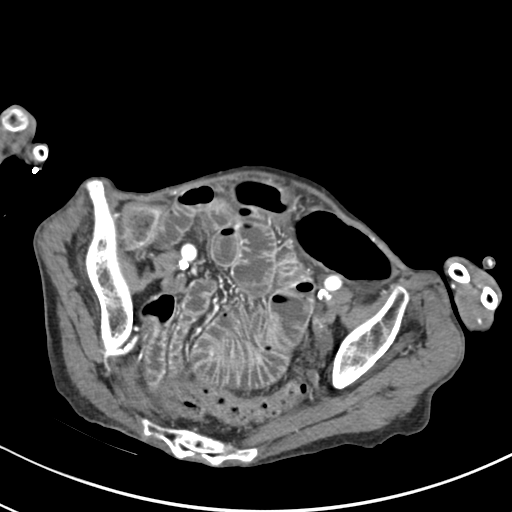
[im 33/76  soft-tissue]
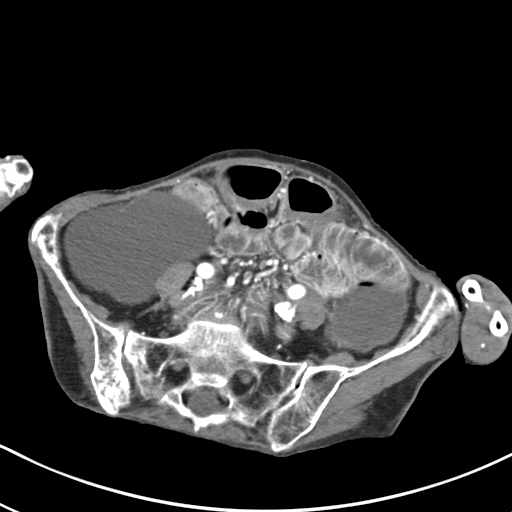
[im 38/76  soft-tissue]
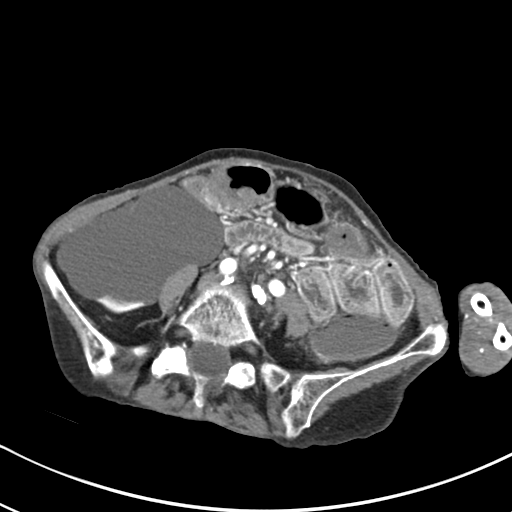
[im 43/76  soft-tissue]
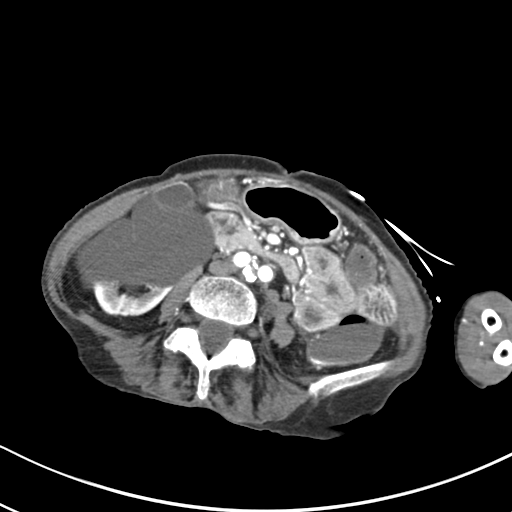
[im 52/76  soft-tissue]
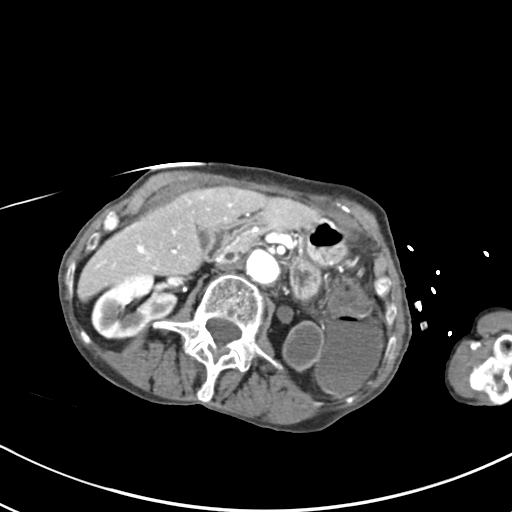
[im 57/76  soft-tissue]
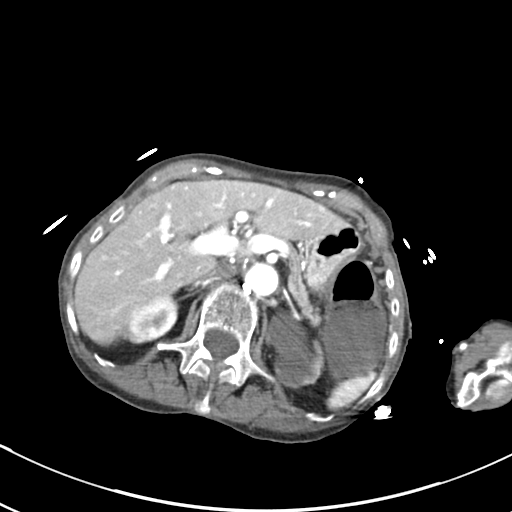
[im 57/76  bone]
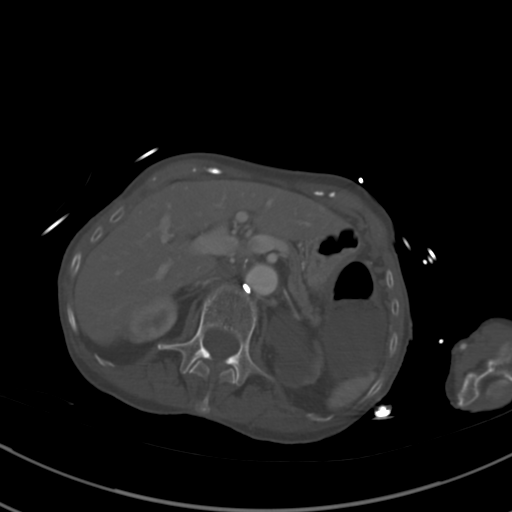
[im 61/76  soft-tissue]
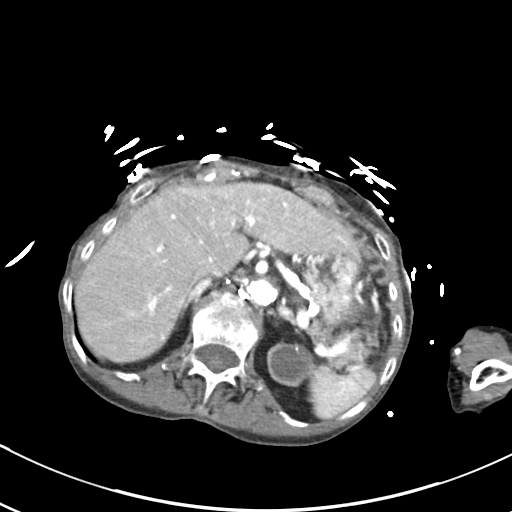
[im 71/76  soft-tissue]
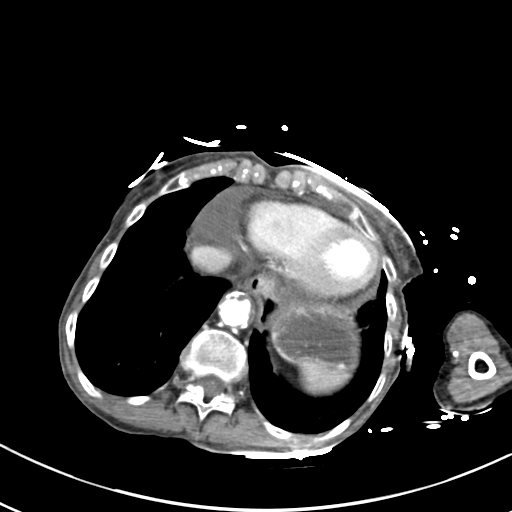

[Series 6: coronals · coronal · 0.70mm/px · 3 of 109 slices shown]
[im 37/109  soft-tissue]
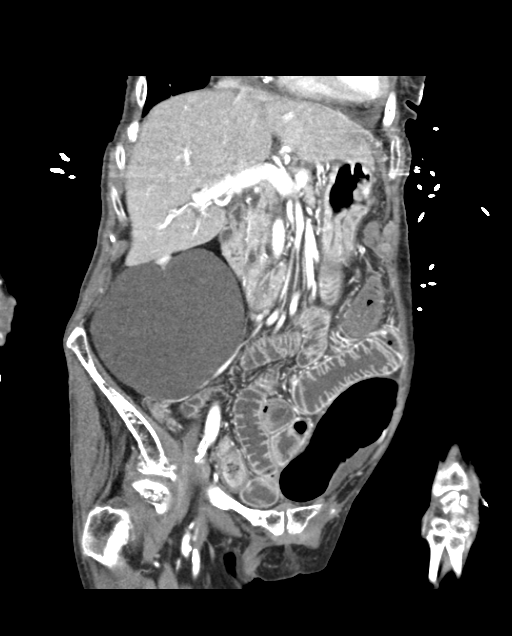
[im 49/109  soft-tissue]
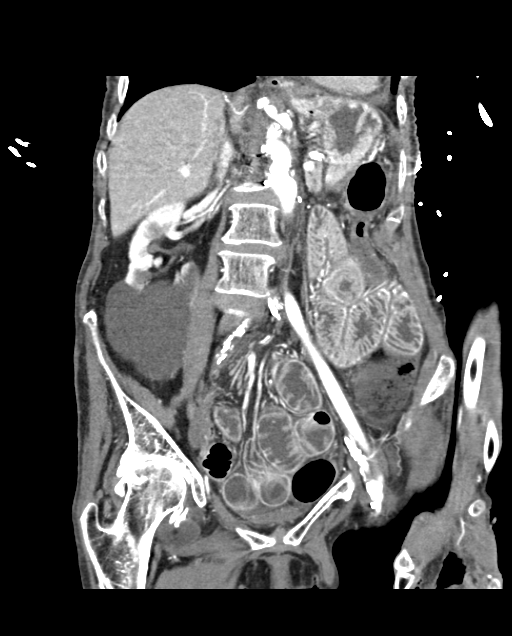
[im 61/109  soft-tissue]
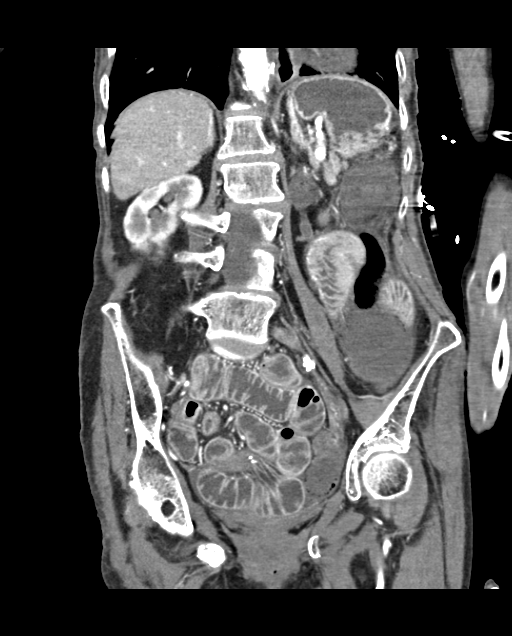

[14 of 46 positions shown; findings below may reference images not displayed]

FINDINGS: Mild nonspecific peribronchial thickening is noted at the lung
bases. A small pericardial effusion is noted.

The liver and spleen are unremarkable in appearance. The gallbladder
is within normal limits. The pancreas and right adrenal gland are
unremarkable. A nonspecific 2.0 cm left adrenal mass is noted.

There is severe chronic left renal atrophy. There is slight right
renal atrophy. A massive cyst is noted at the lower pole of the
right kidney, measuring approximately 10.9 cm. Chronic severe
left-sided hydronephrosis is noted, apparently reflecting a
stricture at the proximal left ureter.

No definite renal or ureteral stones are identified.

There is a somewhat unusual pattern of wall enhancement with regard
to the visualized small bowel, which may reflect an underlying
infectious or inflammatory process. The small bowel is distended up
to 3.0 cm, borderline normal in diameter, without definite evidence
of obstruction.

The stomach is within normal limits. No acute vascular abnormalities
are seen. The patient is status post aortobifemoral bypass graft.
The bypass graft appears fully patent. There is chronic occlusion of
the native underlying vasculature. There is slight ectasia of the
abdominal aorta proximal to the bypass graft, without evidence of
aneurysmal dilatation. Scattered calcific atherosclerotic disease is
seen along the proximal abdominal aorta, with mild associated
intramural thrombus, somewhat irregular in appearance.

The appendix is normal in caliber and contains trace air, without
evidence for appendicitis.

There is suggestion of mild diffuse wall thickening and associated
mild soft tissue inflammation along the ascending and transverse
colon, with perhaps minimal wall thickening along the distal sigmoid
colon. This may reflect a segmental colitis, either infectious or
inflammatory in nature. There is no evidence of ischemia. No
definite focal source of bleeding is identified on this study.

The bladder is not definitely seen. The uterus contains multiple
small calcified fibroids, and is otherwise unremarkable. The ovaries
are difficult to fully assess due to surrounding bowel structures.
No inguinal lymphadenopathy is seen.

No acute osseous abnormalities are identified. There are healed
fractures of the right superior and inferior pubic rami. There is
chronic bilateral hip joint space narrowing, more severe on the
right, with associated sclerotic change and osteophyte formation on
the right side. Nonspecific sclerotic change and expansion are noted
at the right sacroiliac joint.

There is minimal grade 1 anterolisthesis of L3 on L4, reflecting
underlying facet disease.
IMPRESSION: 1. Suggestion of mild diffuse wall thickening and soft tissue
inflammation along the ascending and transverse colon, with perhaps
minimal wall thickening along the distal sigmoid colon. This raises
concern for mild segmental colitis, either infectious or
inflammatory in nature. This could explain the patient's
hematochezia.
2. Somewhat unusual pattern of wall enhancement with regard to the
visualized small bowel, which may reflect an underlying infectious
or inflammatory process. Small bowel is borderline normal in
diameter, without evidence of bowel obstruction.
3. Scattered calcific atherosclerotic disease along the proximal
abdominal aorta, with mild irregular appearing intramural thrombus.
The aortobifemoral bypass graft remains patent.
4. Severe chronic left-sided hydronephrosis, apparently reflecting a
stricture at the proximal left ureter. Severe left renal atrophy
noted.
5. Very large right renal cyst noted.
6. Nonspecific 2.0 cm left adrenal mass noted. Adrenal protocol MRI
or CT could be considered for further evaluation, when and as deemed
clinically appropriate.
7. Small pericardial effusion noted.
8. Nonspecific mild peribronchial thickening noted at the lung
bases.
9. Chronic bilateral hip joint space narrowing, more severe on the
right, with associated right-sided sclerotic change and osteophyte
formation.
